# Patient Record
Sex: Female | Born: 1945 | Race: White | Hispanic: No | Smoking: Never smoker
Health system: Southern US, Community
[De-identification: ages and names within clinical notes are randomized; demographics above are authoritative.]

## PROBLEM LIST (undated history)

## (undated) DIAGNOSIS — G4730 Sleep apnea, unspecified: Secondary | ICD-10-CM

## (undated) DIAGNOSIS — M1990 Unspecified osteoarthritis, unspecified site: Secondary | ICD-10-CM

## (undated) DIAGNOSIS — E039 Hypothyroidism, unspecified: Secondary | ICD-10-CM

## (undated) DIAGNOSIS — I10 Essential (primary) hypertension: Secondary | ICD-10-CM

## (undated) DIAGNOSIS — G2581 Restless legs syndrome: Secondary | ICD-10-CM

## (undated) DIAGNOSIS — E119 Type 2 diabetes mellitus without complications: Secondary | ICD-10-CM

## (undated) DIAGNOSIS — Z972 Presence of dental prosthetic device (complete) (partial): Secondary | ICD-10-CM

## (undated) DIAGNOSIS — Z9109 Other allergy status, other than to drugs and biological substances: Secondary | ICD-10-CM

## (undated) DIAGNOSIS — E785 Hyperlipidemia, unspecified: Secondary | ICD-10-CM

## (undated) DIAGNOSIS — Z8673 Personal history of transient ischemic attack (TIA), and cerebral infarction without residual deficits: Secondary | ICD-10-CM

## (undated) DIAGNOSIS — Z98811 Dental restoration status: Secondary | ICD-10-CM

## (undated) DIAGNOSIS — N8110 Cystocele, unspecified: Secondary | ICD-10-CM

## (undated) DIAGNOSIS — R12 Heartburn: Secondary | ICD-10-CM

## (undated) DIAGNOSIS — N6091 Unspecified benign mammary dysplasia of right breast: Secondary | ICD-10-CM

## (undated) HISTORY — PX: CATARACT EXTRACTION W/ INTRAOCULAR LENS  IMPLANT, BILATERAL: SHX1307

## (undated) HISTORY — PX: FACIAL RECONSTRUCTION SURGERY: SHX631

## (undated) HISTORY — DX: Sleep apnea, unspecified: G47.30

## (undated) HISTORY — PX: KNEE ARTHROSCOPY: SHX127

## (undated) HISTORY — DX: Essential (primary) hypertension: I10

## (undated) HISTORY — DX: Unspecified osteoarthritis, unspecified site: M19.90

---

## 2011-12-27 HISTORY — PX: COLONOSCOPY: SHX174

## 2014-01-20 DIAGNOSIS — I10 Essential (primary) hypertension: Secondary | ICD-10-CM | POA: Diagnosis not present

## 2014-03-25 DIAGNOSIS — E119 Type 2 diabetes mellitus without complications: Secondary | ICD-10-CM | POA: Diagnosis not present

## 2014-04-02 DIAGNOSIS — E782 Mixed hyperlipidemia: Secondary | ICD-10-CM | POA: Diagnosis not present

## 2014-04-02 DIAGNOSIS — G4730 Sleep apnea, unspecified: Secondary | ICD-10-CM | POA: Diagnosis not present

## 2014-04-02 DIAGNOSIS — I10 Essential (primary) hypertension: Secondary | ICD-10-CM | POA: Diagnosis not present

## 2014-04-02 DIAGNOSIS — E119 Type 2 diabetes mellitus without complications: Secondary | ICD-10-CM | POA: Diagnosis not present

## 2014-04-02 DIAGNOSIS — E039 Hypothyroidism, unspecified: Secondary | ICD-10-CM | POA: Diagnosis not present

## 2014-04-02 DIAGNOSIS — Z79899 Other long term (current) drug therapy: Secondary | ICD-10-CM | POA: Diagnosis not present

## 2014-04-02 DIAGNOSIS — E785 Hyperlipidemia, unspecified: Secondary | ICD-10-CM | POA: Diagnosis not present

## 2014-04-10 DIAGNOSIS — Z1231 Encounter for screening mammogram for malignant neoplasm of breast: Secondary | ICD-10-CM | POA: Diagnosis not present

## 2014-05-09 DIAGNOSIS — B351 Tinea unguium: Secondary | ICD-10-CM | POA: Diagnosis not present

## 2014-05-09 DIAGNOSIS — L821 Other seborrheic keratosis: Secondary | ICD-10-CM | POA: Diagnosis not present

## 2014-05-09 DIAGNOSIS — L819 Disorder of pigmentation, unspecified: Secondary | ICD-10-CM | POA: Diagnosis not present

## 2014-06-30 DIAGNOSIS — G4733 Obstructive sleep apnea (adult) (pediatric): Secondary | ICD-10-CM | POA: Diagnosis not present

## 2014-06-30 DIAGNOSIS — G4761 Periodic limb movement disorder: Secondary | ICD-10-CM | POA: Diagnosis not present

## 2014-08-20 DIAGNOSIS — E119 Type 2 diabetes mellitus without complications: Secondary | ICD-10-CM | POA: Diagnosis not present

## 2014-08-20 DIAGNOSIS — Z79899 Other long term (current) drug therapy: Secondary | ICD-10-CM | POA: Diagnosis not present

## 2014-08-20 DIAGNOSIS — G4730 Sleep apnea, unspecified: Secondary | ICD-10-CM | POA: Diagnosis not present

## 2014-08-20 DIAGNOSIS — E785 Hyperlipidemia, unspecified: Secondary | ICD-10-CM | POA: Diagnosis not present

## 2014-08-20 DIAGNOSIS — I10 Essential (primary) hypertension: Secondary | ICD-10-CM | POA: Diagnosis not present

## 2014-08-20 DIAGNOSIS — E039 Hypothyroidism, unspecified: Secondary | ICD-10-CM | POA: Diagnosis not present

## 2014-08-20 DIAGNOSIS — E559 Vitamin D deficiency, unspecified: Secondary | ICD-10-CM | POA: Diagnosis not present

## 2014-08-20 DIAGNOSIS — E782 Mixed hyperlipidemia: Secondary | ICD-10-CM | POA: Diagnosis not present

## 2014-12-08 DIAGNOSIS — E119 Type 2 diabetes mellitus without complications: Secondary | ICD-10-CM | POA: Diagnosis not present

## 2014-12-08 DIAGNOSIS — E785 Hyperlipidemia, unspecified: Secondary | ICD-10-CM | POA: Diagnosis not present

## 2014-12-08 DIAGNOSIS — I10 Essential (primary) hypertension: Secondary | ICD-10-CM | POA: Diagnosis not present

## 2014-12-30 DIAGNOSIS — Z0000 Encounter for general adult medical examination without abnormal findings: Secondary | ICD-10-CM | POA: Diagnosis not present

## 2014-12-30 DIAGNOSIS — K219 Gastro-esophageal reflux disease without esophagitis: Secondary | ICD-10-CM | POA: Diagnosis not present

## 2014-12-30 DIAGNOSIS — E039 Hypothyroidism, unspecified: Secondary | ICD-10-CM | POA: Diagnosis not present

## 2014-12-30 DIAGNOSIS — I10 Essential (primary) hypertension: Secondary | ICD-10-CM | POA: Diagnosis not present

## 2014-12-30 DIAGNOSIS — E782 Mixed hyperlipidemia: Secondary | ICD-10-CM | POA: Diagnosis not present

## 2014-12-30 DIAGNOSIS — E0800 Diabetes mellitus due to underlying condition with hyperosmolarity without nonketotic hyperglycemic-hyperosmolar coma (NKHHC): Secondary | ICD-10-CM | POA: Diagnosis not present

## 2014-12-30 DIAGNOSIS — M1991 Primary osteoarthritis, unspecified site: Secondary | ICD-10-CM | POA: Diagnosis not present

## 2014-12-31 DIAGNOSIS — H40013 Open angle with borderline findings, low risk, bilateral: Secondary | ICD-10-CM | POA: Diagnosis not present

## 2014-12-31 DIAGNOSIS — H25813 Combined forms of age-related cataract, bilateral: Secondary | ICD-10-CM | POA: Diagnosis not present

## 2014-12-31 DIAGNOSIS — E119 Type 2 diabetes mellitus without complications: Secondary | ICD-10-CM | POA: Diagnosis not present

## 2015-01-13 DIAGNOSIS — I10 Essential (primary) hypertension: Secondary | ICD-10-CM | POA: Diagnosis not present

## 2015-01-13 DIAGNOSIS — E782 Mixed hyperlipidemia: Secondary | ICD-10-CM | POA: Diagnosis not present

## 2015-01-13 DIAGNOSIS — E0800 Diabetes mellitus due to underlying condition with hyperosmolarity without nonketotic hyperglycemic-hyperosmolar coma (NKHHC): Secondary | ICD-10-CM | POA: Diagnosis not present

## 2015-02-02 DIAGNOSIS — H2512 Age-related nuclear cataract, left eye: Secondary | ICD-10-CM | POA: Diagnosis not present

## 2015-02-02 DIAGNOSIS — H25812 Combined forms of age-related cataract, left eye: Secondary | ICD-10-CM | POA: Diagnosis not present

## 2015-02-11 DIAGNOSIS — Z780 Asymptomatic menopausal state: Secondary | ICD-10-CM | POA: Diagnosis not present

## 2015-02-11 DIAGNOSIS — M810 Age-related osteoporosis without current pathological fracture: Secondary | ICD-10-CM | POA: Diagnosis not present

## 2015-04-07 DIAGNOSIS — H2511 Age-related nuclear cataract, right eye: Secondary | ICD-10-CM | POA: Diagnosis not present

## 2015-04-13 DIAGNOSIS — H2511 Age-related nuclear cataract, right eye: Secondary | ICD-10-CM | POA: Diagnosis not present

## 2015-04-13 DIAGNOSIS — H25811 Combined forms of age-related cataract, right eye: Secondary | ICD-10-CM | POA: Diagnosis not present

## 2015-05-13 DIAGNOSIS — R7309 Other abnormal glucose: Secondary | ICD-10-CM | POA: Diagnosis not present

## 2015-06-02 DIAGNOSIS — E0810 Diabetes mellitus due to underlying condition with ketoacidosis without coma: Secondary | ICD-10-CM | POA: Diagnosis not present

## 2015-06-02 DIAGNOSIS — I10 Essential (primary) hypertension: Secondary | ICD-10-CM | POA: Diagnosis not present

## 2015-06-30 DIAGNOSIS — Z1231 Encounter for screening mammogram for malignant neoplasm of breast: Secondary | ICD-10-CM | POA: Diagnosis not present

## 2015-09-29 DIAGNOSIS — E0801 Diabetes mellitus due to underlying condition with hyperosmolarity with coma: Secondary | ICD-10-CM | POA: Diagnosis not present

## 2015-09-29 DIAGNOSIS — I10 Essential (primary) hypertension: Secondary | ICD-10-CM | POA: Diagnosis not present

## 2015-09-29 DIAGNOSIS — E0800 Diabetes mellitus due to underlying condition with hyperosmolarity without nonketotic hyperglycemic-hyperosmolar coma (NKHHC): Secondary | ICD-10-CM | POA: Diagnosis not present

## 2015-09-29 DIAGNOSIS — Z23 Encounter for immunization: Secondary | ICD-10-CM | POA: Diagnosis not present

## 2015-10-13 DIAGNOSIS — I10 Essential (primary) hypertension: Secondary | ICD-10-CM | POA: Diagnosis not present

## 2015-10-13 DIAGNOSIS — E118 Type 2 diabetes mellitus with unspecified complications: Secondary | ICD-10-CM | POA: Diagnosis not present

## 2015-10-13 DIAGNOSIS — Z6829 Body mass index (BMI) 29.0-29.9, adult: Secondary | ICD-10-CM | POA: Diagnosis not present

## 2016-01-07 DIAGNOSIS — H26493 Other secondary cataract, bilateral: Secondary | ICD-10-CM | POA: Diagnosis not present

## 2016-01-07 DIAGNOSIS — Z961 Presence of intraocular lens: Secondary | ICD-10-CM | POA: Diagnosis not present

## 2016-01-07 DIAGNOSIS — E119 Type 2 diabetes mellitus without complications: Secondary | ICD-10-CM | POA: Diagnosis not present

## 2016-01-07 DIAGNOSIS — E0801 Diabetes mellitus due to underlying condition with hyperosmolarity with coma: Secondary | ICD-10-CM | POA: Diagnosis not present

## 2016-01-07 DIAGNOSIS — I10 Essential (primary) hypertension: Secondary | ICD-10-CM | POA: Diagnosis not present

## 2016-01-07 DIAGNOSIS — E782 Mixed hyperlipidemia: Secondary | ICD-10-CM | POA: Diagnosis not present

## 2016-01-07 DIAGNOSIS — H40013 Open angle with borderline findings, low risk, bilateral: Secondary | ICD-10-CM | POA: Diagnosis not present

## 2016-01-07 DIAGNOSIS — E038 Other specified hypothyroidism: Secondary | ICD-10-CM | POA: Diagnosis not present

## 2016-01-11 DIAGNOSIS — E782 Mixed hyperlipidemia: Secondary | ICD-10-CM | POA: Diagnosis not present

## 2016-01-11 DIAGNOSIS — M1991 Primary osteoarthritis, unspecified site: Secondary | ICD-10-CM | POA: Diagnosis not present

## 2016-01-11 DIAGNOSIS — I10 Essential (primary) hypertension: Secondary | ICD-10-CM | POA: Diagnosis not present

## 2016-01-11 DIAGNOSIS — E089 Diabetes mellitus due to underlying condition without complications: Secondary | ICD-10-CM | POA: Diagnosis not present

## 2016-01-20 DIAGNOSIS — H26493 Other secondary cataract, bilateral: Secondary | ICD-10-CM | POA: Diagnosis not present

## 2016-03-28 DIAGNOSIS — M170 Bilateral primary osteoarthritis of knee: Secondary | ICD-10-CM | POA: Diagnosis not present

## 2016-04-07 DIAGNOSIS — M216X2 Other acquired deformities of left foot: Secondary | ICD-10-CM | POA: Diagnosis not present

## 2016-04-07 DIAGNOSIS — M2012 Hallux valgus (acquired), left foot: Secondary | ICD-10-CM | POA: Diagnosis not present

## 2016-04-07 DIAGNOSIS — E119 Type 2 diabetes mellitus without complications: Secondary | ICD-10-CM | POA: Diagnosis not present

## 2016-04-07 DIAGNOSIS — M2011 Hallux valgus (acquired), right foot: Secondary | ICD-10-CM | POA: Diagnosis not present

## 2016-04-07 DIAGNOSIS — M216X1 Other acquired deformities of right foot: Secondary | ICD-10-CM | POA: Diagnosis not present

## 2016-04-11 DIAGNOSIS — E782 Mixed hyperlipidemia: Secondary | ICD-10-CM | POA: Diagnosis not present

## 2016-04-11 DIAGNOSIS — I10 Essential (primary) hypertension: Secondary | ICD-10-CM | POA: Diagnosis not present

## 2016-04-11 DIAGNOSIS — G5603 Carpal tunnel syndrome, bilateral upper limbs: Secondary | ICD-10-CM | POA: Diagnosis not present

## 2016-04-11 DIAGNOSIS — E089 Diabetes mellitus due to underlying condition without complications: Secondary | ICD-10-CM | POA: Diagnosis not present

## 2016-04-11 DIAGNOSIS — M1991 Primary osteoarthritis, unspecified site: Secondary | ICD-10-CM | POA: Diagnosis not present

## 2016-04-12 DIAGNOSIS — I10 Essential (primary) hypertension: Secondary | ICD-10-CM | POA: Diagnosis not present

## 2016-04-12 DIAGNOSIS — E782 Mixed hyperlipidemia: Secondary | ICD-10-CM | POA: Diagnosis not present

## 2016-04-12 DIAGNOSIS — E089 Diabetes mellitus due to underlying condition without complications: Secondary | ICD-10-CM | POA: Diagnosis not present

## 2016-04-12 DIAGNOSIS — M1991 Primary osteoarthritis, unspecified site: Secondary | ICD-10-CM | POA: Diagnosis not present

## 2016-05-05 DIAGNOSIS — D1801 Hemangioma of skin and subcutaneous tissue: Secondary | ICD-10-CM | POA: Diagnosis not present

## 2016-05-05 DIAGNOSIS — L814 Other melanin hyperpigmentation: Secondary | ICD-10-CM | POA: Diagnosis not present

## 2016-05-05 DIAGNOSIS — L918 Other hypertrophic disorders of the skin: Secondary | ICD-10-CM | POA: Diagnosis not present

## 2016-05-05 DIAGNOSIS — L84 Corns and callosities: Secondary | ICD-10-CM | POA: Diagnosis not present

## 2016-05-05 DIAGNOSIS — L821 Other seborrheic keratosis: Secondary | ICD-10-CM | POA: Diagnosis not present

## 2016-05-10 DIAGNOSIS — E039 Hypothyroidism, unspecified: Secondary | ICD-10-CM | POA: Diagnosis not present

## 2016-05-10 DIAGNOSIS — E0800 Diabetes mellitus due to underlying condition with hyperosmolarity without nonketotic hyperglycemic-hyperosmolar coma (NKHHC): Secondary | ICD-10-CM | POA: Diagnosis not present

## 2016-05-10 DIAGNOSIS — I10 Essential (primary) hypertension: Secondary | ICD-10-CM | POA: Diagnosis not present

## 2016-05-10 DIAGNOSIS — E782 Mixed hyperlipidemia: Secondary | ICD-10-CM | POA: Diagnosis not present

## 2016-06-09 DIAGNOSIS — E782 Mixed hyperlipidemia: Secondary | ICD-10-CM | POA: Diagnosis not present

## 2016-06-09 DIAGNOSIS — M1991 Primary osteoarthritis, unspecified site: Secondary | ICD-10-CM | POA: Diagnosis not present

## 2016-06-09 DIAGNOSIS — I10 Essential (primary) hypertension: Secondary | ICD-10-CM | POA: Diagnosis not present

## 2016-06-09 DIAGNOSIS — E039 Hypothyroidism, unspecified: Secondary | ICD-10-CM | POA: Diagnosis not present

## 2016-07-14 DIAGNOSIS — Z803 Family history of malignant neoplasm of breast: Secondary | ICD-10-CM | POA: Diagnosis not present

## 2016-07-14 DIAGNOSIS — Z1231 Encounter for screening mammogram for malignant neoplasm of breast: Secondary | ICD-10-CM | POA: Diagnosis not present

## 2016-09-05 DIAGNOSIS — M1991 Primary osteoarthritis, unspecified site: Secondary | ICD-10-CM | POA: Diagnosis not present

## 2016-09-05 DIAGNOSIS — E0800 Diabetes mellitus due to underlying condition with hyperosmolarity without nonketotic hyperglycemic-hyperosmolar coma (NKHHC): Secondary | ICD-10-CM | POA: Diagnosis not present

## 2016-09-05 DIAGNOSIS — E782 Mixed hyperlipidemia: Secondary | ICD-10-CM | POA: Diagnosis not present

## 2016-09-05 DIAGNOSIS — I10 Essential (primary) hypertension: Secondary | ICD-10-CM | POA: Diagnosis not present

## 2016-09-05 DIAGNOSIS — G5603 Carpal tunnel syndrome, bilateral upper limbs: Secondary | ICD-10-CM | POA: Diagnosis not present

## 2016-09-07 DIAGNOSIS — I10 Essential (primary) hypertension: Secondary | ICD-10-CM | POA: Diagnosis not present

## 2016-09-07 DIAGNOSIS — E039 Hypothyroidism, unspecified: Secondary | ICD-10-CM | POA: Diagnosis not present

## 2016-09-07 DIAGNOSIS — E785 Hyperlipidemia, unspecified: Secondary | ICD-10-CM | POA: Diagnosis not present

## 2016-09-07 DIAGNOSIS — G259 Extrapyramidal and movement disorder, unspecified: Secondary | ICD-10-CM | POA: Diagnosis not present

## 2016-11-16 DIAGNOSIS — Z6830 Body mass index (BMI) 30.0-30.9, adult: Secondary | ICD-10-CM | POA: Diagnosis not present

## 2016-11-16 DIAGNOSIS — E785 Hyperlipidemia, unspecified: Secondary | ICD-10-CM | POA: Diagnosis not present

## 2016-11-16 DIAGNOSIS — E7801 Familial hypercholesterolemia: Secondary | ICD-10-CM | POA: Diagnosis not present

## 2016-11-16 DIAGNOSIS — E118 Type 2 diabetes mellitus with unspecified complications: Secondary | ICD-10-CM | POA: Diagnosis not present

## 2016-11-16 DIAGNOSIS — I10 Essential (primary) hypertension: Secondary | ICD-10-CM | POA: Diagnosis not present

## 2016-11-28 DIAGNOSIS — E039 Hypothyroidism, unspecified: Secondary | ICD-10-CM | POA: Diagnosis not present

## 2016-11-28 DIAGNOSIS — E782 Mixed hyperlipidemia: Secondary | ICD-10-CM | POA: Diagnosis not present

## 2016-11-28 DIAGNOSIS — R232 Flushing: Secondary | ICD-10-CM | POA: Diagnosis not present

## 2016-11-28 DIAGNOSIS — E0810 Diabetes mellitus due to underlying condition with ketoacidosis without coma: Secondary | ICD-10-CM | POA: Diagnosis not present

## 2016-12-21 DIAGNOSIS — R232 Flushing: Secondary | ICD-10-CM | POA: Diagnosis not present

## 2016-12-21 DIAGNOSIS — E119 Type 2 diabetes mellitus without complications: Secondary | ICD-10-CM | POA: Diagnosis not present

## 2017-01-09 DIAGNOSIS — I10 Essential (primary) hypertension: Secondary | ICD-10-CM | POA: Diagnosis not present

## 2017-01-09 DIAGNOSIS — E0810 Diabetes mellitus due to underlying condition with ketoacidosis without coma: Secondary | ICD-10-CM | POA: Diagnosis not present

## 2017-02-03 DIAGNOSIS — B302 Viral pharyngoconjunctivitis: Secondary | ICD-10-CM | POA: Diagnosis not present

## 2017-02-03 DIAGNOSIS — H6506 Acute serous otitis media, recurrent, bilateral: Secondary | ICD-10-CM | POA: Diagnosis not present

## 2017-02-03 DIAGNOSIS — J1189 Influenza due to unidentified influenza virus with other manifestations: Secondary | ICD-10-CM | POA: Diagnosis not present

## 2017-02-16 DIAGNOSIS — E782 Mixed hyperlipidemia: Secondary | ICD-10-CM | POA: Diagnosis not present

## 2017-02-16 DIAGNOSIS — I10 Essential (primary) hypertension: Secondary | ICD-10-CM | POA: Diagnosis not present

## 2017-02-16 DIAGNOSIS — H6506 Acute serous otitis media, recurrent, bilateral: Secondary | ICD-10-CM | POA: Diagnosis not present

## 2017-03-10 DIAGNOSIS — H40013 Open angle with borderline findings, low risk, bilateral: Secondary | ICD-10-CM | POA: Diagnosis not present

## 2017-03-10 DIAGNOSIS — Z961 Presence of intraocular lens: Secondary | ICD-10-CM | POA: Diagnosis not present

## 2017-03-10 DIAGNOSIS — E119 Type 2 diabetes mellitus without complications: Secondary | ICD-10-CM | POA: Diagnosis not present

## 2017-04-03 DIAGNOSIS — H6506 Acute serous otitis media, recurrent, bilateral: Secondary | ICD-10-CM | POA: Diagnosis not present

## 2017-04-03 DIAGNOSIS — I10 Essential (primary) hypertension: Secondary | ICD-10-CM | POA: Diagnosis not present

## 2017-04-03 DIAGNOSIS — E782 Mixed hyperlipidemia: Secondary | ICD-10-CM | POA: Diagnosis not present

## 2017-04-10 DIAGNOSIS — I10 Essential (primary) hypertension: Secondary | ICD-10-CM | POA: Diagnosis not present

## 2017-04-10 DIAGNOSIS — E118 Type 2 diabetes mellitus with unspecified complications: Secondary | ICD-10-CM | POA: Diagnosis not present

## 2017-04-10 DIAGNOSIS — E782 Mixed hyperlipidemia: Secondary | ICD-10-CM | POA: Diagnosis not present

## 2017-04-10 DIAGNOSIS — R232 Flushing: Secondary | ICD-10-CM | POA: Diagnosis not present

## 2017-05-19 ENCOUNTER — Ambulatory Visit
Admission: RE | Admit: 2017-05-19 | Discharge: 2017-05-19 | Disposition: A | Discharge status: Home / Self Care | Payer: Medicare Other | Source: Ambulatory Visit | Attending: Family Medicine | Admitting: Family Medicine

## 2017-05-19 ENCOUNTER — Other Ambulatory Visit: Payer: Self-pay | Admitting: Family Medicine

## 2017-05-19 DIAGNOSIS — R05 Cough: Secondary | ICD-10-CM

## 2017-05-19 DIAGNOSIS — E034 Atrophy of thyroid (acquired): Secondary | ICD-10-CM | POA: Diagnosis not present

## 2017-05-19 DIAGNOSIS — E039 Hypothyroidism, unspecified: Secondary | ICD-10-CM | POA: Diagnosis not present

## 2017-05-19 DIAGNOSIS — R059 Cough, unspecified: Secondary | ICD-10-CM

## 2017-05-19 DIAGNOSIS — E782 Mixed hyperlipidemia: Secondary | ICD-10-CM | POA: Diagnosis not present

## 2017-05-19 DIAGNOSIS — E0810 Diabetes mellitus due to underlying condition with ketoacidosis without coma: Secondary | ICD-10-CM | POA: Diagnosis not present

## 2017-05-19 DIAGNOSIS — I10 Essential (primary) hypertension: Secondary | ICD-10-CM | POA: Diagnosis not present

## 2017-06-23 DIAGNOSIS — E0810 Diabetes mellitus due to underlying condition with ketoacidosis without coma: Secondary | ICD-10-CM | POA: Diagnosis not present

## 2017-06-23 DIAGNOSIS — N390 Urinary tract infection, site not specified: Secondary | ICD-10-CM | POA: Diagnosis not present

## 2017-06-29 DIAGNOSIS — E118 Type 2 diabetes mellitus with unspecified complications: Secondary | ICD-10-CM | POA: Diagnosis not present

## 2017-07-10 DIAGNOSIS — I110 Hypertensive heart disease with heart failure: Secondary | ICD-10-CM | POA: Diagnosis not present

## 2017-07-10 DIAGNOSIS — E118 Type 2 diabetes mellitus with unspecified complications: Secondary | ICD-10-CM | POA: Diagnosis not present

## 2017-07-10 DIAGNOSIS — I10 Essential (primary) hypertension: Secondary | ICD-10-CM | POA: Diagnosis not present

## 2017-07-11 DIAGNOSIS — E118 Type 2 diabetes mellitus with unspecified complications: Secondary | ICD-10-CM | POA: Diagnosis not present

## 2017-07-17 DIAGNOSIS — Z803 Family history of malignant neoplasm of breast: Secondary | ICD-10-CM | POA: Diagnosis not present

## 2017-07-17 DIAGNOSIS — Z1231 Encounter for screening mammogram for malignant neoplasm of breast: Secondary | ICD-10-CM | POA: Diagnosis not present

## 2017-07-21 DIAGNOSIS — R591 Generalized enlarged lymph nodes: Secondary | ICD-10-CM | POA: Diagnosis not present

## 2017-07-21 DIAGNOSIS — R921 Mammographic calcification found on diagnostic imaging of breast: Secondary | ICD-10-CM | POA: Diagnosis not present

## 2017-07-24 ENCOUNTER — Other Ambulatory Visit: Payer: Self-pay | Admitting: Radiology

## 2017-07-24 DIAGNOSIS — M25561 Pain in right knee: Secondary | ICD-10-CM | POA: Diagnosis not present

## 2017-07-24 DIAGNOSIS — R921 Mammographic calcification found on diagnostic imaging of breast: Secondary | ICD-10-CM | POA: Diagnosis not present

## 2017-07-24 DIAGNOSIS — R591 Generalized enlarged lymph nodes: Secondary | ICD-10-CM | POA: Diagnosis not present

## 2017-07-24 DIAGNOSIS — M1711 Unilateral primary osteoarthritis, right knee: Secondary | ICD-10-CM | POA: Diagnosis not present

## 2017-07-24 DIAGNOSIS — M25562 Pain in left knee: Secondary | ICD-10-CM | POA: Diagnosis not present

## 2017-07-24 DIAGNOSIS — M1712 Unilateral primary osteoarthritis, left knee: Secondary | ICD-10-CM | POA: Diagnosis not present

## 2017-07-24 DIAGNOSIS — N6091 Unspecified benign mammary dysplasia of right breast: Secondary | ICD-10-CM | POA: Diagnosis not present

## 2017-08-02 ENCOUNTER — Emergency Department (HOSPITAL_COMMUNITY): Payer: Medicare Other

## 2017-08-02 ENCOUNTER — Emergency Department (HOSPITAL_COMMUNITY)
Admission: EM | Admit: 2017-08-02 | Discharge: 2017-08-02 | Disposition: A | Discharge status: Home / Self Care | Payer: Medicare Other | Attending: Emergency Medicine | Admitting: Emergency Medicine

## 2017-08-02 ENCOUNTER — Encounter (HOSPITAL_COMMUNITY): Payer: Self-pay | Admitting: Emergency Medicine

## 2017-08-02 DIAGNOSIS — R202 Paresthesia of skin: Secondary | ICD-10-CM | POA: Diagnosis not present

## 2017-08-02 DIAGNOSIS — Z7984 Long term (current) use of oral hypoglycemic drugs: Secondary | ICD-10-CM | POA: Diagnosis not present

## 2017-08-02 DIAGNOSIS — R110 Nausea: Secondary | ICD-10-CM | POA: Diagnosis not present

## 2017-08-02 DIAGNOSIS — Z7982 Long term (current) use of aspirin: Secondary | ICD-10-CM | POA: Diagnosis not present

## 2017-08-02 DIAGNOSIS — E119 Type 2 diabetes mellitus without complications: Secondary | ICD-10-CM | POA: Insufficient documentation

## 2017-08-02 DIAGNOSIS — Z79899 Other long term (current) drug therapy: Secondary | ICD-10-CM | POA: Insufficient documentation

## 2017-08-02 DIAGNOSIS — R5383 Other fatigue: Secondary | ICD-10-CM | POA: Insufficient documentation

## 2017-08-02 DIAGNOSIS — R200 Anesthesia of skin: Secondary | ICD-10-CM | POA: Diagnosis not present

## 2017-08-02 LAB — CBC
HEMATOCRIT: 34.6 % — AB (ref 36.0–46.0)
Hemoglobin: 11.3 g/dL — ABNORMAL LOW (ref 12.0–15.0)
MCH: 26.8 pg (ref 26.0–34.0)
MCHC: 32.7 g/dL (ref 30.0–36.0)
MCV: 82.2 fL (ref 78.0–100.0)
Platelets: 253 10*3/uL (ref 150–400)
RBC: 4.21 MIL/uL (ref 3.87–5.11)
RDW: 15.3 % (ref 11.5–15.5)
WBC: 12 10*3/uL — ABNORMAL HIGH (ref 4.0–10.5)

## 2017-08-02 LAB — I-STAT TROPONIN, ED: Troponin i, poc: 0.01 ng/mL (ref 0.00–0.08)

## 2017-08-02 LAB — BASIC METABOLIC PANEL
Anion gap: 10 (ref 5–15)
BUN: 22 mg/dL — AB (ref 6–20)
CHLORIDE: 103 mmol/L (ref 101–111)
CO2: 23 mmol/L (ref 22–32)
CREATININE: 0.66 mg/dL (ref 0.44–1.00)
Calcium: 9.4 mg/dL (ref 8.9–10.3)
GFR calc Af Amer: >60 mL/min (ref >60)
GFR calc non Af Amer: >60 mL/min (ref >60)
Glucose, Bld: 253 mg/dL — ABNORMAL HIGH (ref 65–99)
POTASSIUM: 4.5 mmol/L (ref 3.5–5.1)
Sodium: 136 mmol/L (ref 135–145)

## 2017-08-02 LAB — CBG MONITORING, ED: Glucose-Capillary: 237 mg/dL — ABNORMAL HIGH (ref 65–99)

## 2017-08-02 MED ORDER — SODIUM CHLORIDE 0.9 % IV BOLUS (SEPSIS)
1000.0000 mL | Freq: Once | INTRAVENOUS | Status: AC
  Administered 2017-08-02: 1000 mL via INTRAVENOUS

## 2017-08-02 NOTE — ED Provider Notes (Signed)
Patient visit shared.  Patient here following an episode of paresthesias to her left hand and she felt hot and diaphoretic at that time. Her symptoms have resolved in the emergency department. MRI is negative for acute abnormality per radiology. The troponin 2 is negative and EKG with no ischemic changes. Plan to DC home with outpatient follow-up and return precautions.   Quintella Reichert, MD 08/02/17 [phone]

## 2017-08-02 NOTE — ED Notes (Addendum)
Jill Contreras/daughter [phone]

## 2017-08-02 NOTE — ED Triage Notes (Addendum)
Patient reports that she was sitting in floor reading to grandson when "felt bad and broke out in cold sweat then my left arm went numb".  Patient also states that she had onset of nausea and have to have BM.  Patient has less numbness now but has some numbness.  Patient has no neuro deficits in triage at this time. Patient adds that last night she was having leg cramps. Patient had recent BP medication change.

## 2017-08-03 LAB — I-STAT TROPONIN, ED: TROPONIN I, POC: 0 ng/mL (ref 0.00–0.08)

## 2017-08-03 NOTE — ED Provider Notes (Signed)
[facility] DEPT Provider Note   CSN: [id number] Arrival date & time: 08/02/17  1026     History   Chief Complaint Chief Complaint  Patient presents with  . Fatigue  . arm numbness    left     HPI Jill Contreras is a 71 y.o. female hx of DM, HL, previous TIA here with L arm paresthesias. She woke up around 7 am and was feeling fine. Around 8 am, she was reading to her grandson and had sudden onset of L arm paresthesias and diaphoresis. She felt like she needed to have a bowel movement as well. She then had R facial numbness about 30 minutes later. She states that she came to the ED and the symptoms resolved on arrival. She denies trouble speaking or weakness. She had similar symptoms 15 years ago and was admitted and had MRI that didn't show a stroke and was diagnosed with TIA and is still taking ASA 325 mg daily. Denies chest pain during this episode. Denies hx of CAD.   The history is provided by the patient.    Past Medical History:  Diagnosis Date  . Diabetes mellitus without complication ([place])   . High cholesterol     There are no active problems to display for this patient.   Past Surgical History:  Procedure Laterality Date  . BIOPSY BREAST    . KNEE SURGERY      OB History    No data available       Home Medications    Prior to Admission medications   Medication Sig Start Date End Date Taking? Authorizing Provider  aspirin EC 325 MG tablet Take 325 mg by mouth at bedtime.   Yes [provider]  aspirin EC 81 MG tablet Take 162 mg by mouth every 4 (four) hours as needed for mild pain.   Yes [provider]  atorvastatin (LIPITOR) 80 MG tablet Take 80 mg by mouth at bedtime.   Yes [provider]  ezetimibe (ZETIA) 10 MG tablet Take 10 mg by mouth daily.   Yes [provider]  levothyroxine (SYNTHROID, LEVOTHROID) 100 MCG tablet Take 100 mcg by mouth daily before breakfast.   Yes [provider]  metFORMIN  (GLUCOPHAGE) 1000 MG tablet Take 500 mg by mouth 3 (three) times daily.  05/09/17  Yes [provider]  Multiple Vitamins-Minerals (ADULT GUMMY) CHEW Chew 2 each by mouth daily.   Yes [provider]  omeprazole (PRILOSEC) 20 MG capsule Take 20 mg by mouth daily.   Yes [provider]  pramipexole (MIRAPEX) 0.5 MG tablet Take 0.5 mg by mouth at bedtime.   Yes [provider]  pramipexole (MIRAPEX) 1 MG tablet Take 1 mg by mouth at bedtime.  07/05/17  Yes [provider]  sacubitril-valsartan (ENTRESTO) 49-51 MG Take 1 tablet by mouth 2 (two) times daily.   Yes [provider]  sitaGLIPtin (JANUVIA) 100 MG tablet Take 100 mg by mouth daily.   Yes [provider]    Family History No family history on file.  Social History Social History  Substance Use Topics  . Smoking status: Never Smoker  . Smokeless tobacco: Never Used  . Alcohol use No     Allergies   Mango flavor; Poison ivy extract; and Penicillins   Review of Systems Review of Systems  Neurological: Positive for numbness.  All other systems reviewed and are negative.    Physical Exam Updated Vital Signs BP (!) 119/57 (BP  Location: Left Arm)   Pulse 84   Temp 97.8 F (36.6 C) (Oral)   Resp 16   Ht 5\' 5"  (1.651 m)   Wt 78.9 kg (174 lb)   SpO2 100%   BMI 28.96 kg/m   Physical Exam  Constitutional: She is oriented to person, place, and time. She appears well-developed.  HENT:  Head: Normocephalic.  Mouth/Throat: Oropharynx is clear and moist.  Eyes: Pupils are equal, round, and reactive to light. Conjunctivae and EOM are normal.  Neck: Normal range of motion. Neck supple.  Cardiovascular: Normal rate, regular rhythm and normal heart sounds.   Pulmonary/Chest: Effort normal and breath sounds normal. No respiratory distress. She has no wheezes.  Abdominal: Soft. Bowel sounds are normal. She exhibits no distension. There is no tenderness.    Musculoskeletal: Normal range of motion.  Neurological: She is alert and oriented to person, place, and time. No cranial nerve deficit. Coordination normal.  CN 2-12 intact. Nl strength and sensation throughout. Nl finger to nose. Nl gait   Skin: Skin is warm.  Psychiatric: She has a normal mood and affect.  Nursing note and vitals reviewed.    ED Treatments / Results  Labs (all labs ordered are listed, but only abnormal results are displayed) Labs Reviewed  BASIC METABOLIC PANEL - Abnormal; Notable for the following:       Result Value   Glucose, Bld 253 (*)    BUN 22 (*)    All other components within normal limits  CBC - Abnormal; Notable for the following:    WBC 12.0 (*)    Hemoglobin 11.3 (*)    HCT 34.6 (*)    All other components within normal limits  CBG MONITORING, ED - Abnormal; Notable for the following:    Glucose-Capillary 237 (*)    All other components within normal limits  I-STAT TROPONIN, ED  I-STAT TROPONIN, ED    EKG  EKG Interpretation  Date/Time:  Wednesday August 02 2017 13:25:02 EDT Ventricular Rate:  79 PR Interval:    QRS Duration: 100 QT Interval:  391 QTC Calculation: 449 R Axis:   20 Text Interpretation:  Sinus rhythm RSR' in V1 or V2, right VCD or RVH No previous ECGs available Confirmed by Wandra Arthurs [phone]) on 08/02/2017 1:40:16 PM       Radiology Dg Chest 2 View  Result Date: 08/02/2017 CLINICAL DATA:  Acute onset left arm numbness, diaphoresis, and nausea today. EXAM: CHEST  2 VIEW COMPARISON:  05/19/2017 FINDINGS: The heart size and mediastinal contours are within normal limits. Aortic atherosclerosis. Both lungs are clear. Mild thoracic spine degenerative disc disease noted. IMPRESSION: No active cardiopulmonary disease. Electronically Signed   By: Earle Gell M.D.   On: 08/02/2017 13:56   Mr Brain Wo Contrast  Result Date: 08/02/2017 CLINICAL DATA:  71 y/o  F; left arm numbness. EXAM: MRI HEAD WITHOUT CONTRAST TECHNIQUE:  Multiplanar, multiecho pulse sequences of the brain and surrounding structures were obtained without intravenous contrast. COMPARISON:  None. FINDINGS: Brain: No acute infarction, hemorrhage, hydrocephalus, extra-axial collection or mass lesion. Vascular: Normal flow voids. Skull and upper cervical spine: Normal marrow signal. Sinuses/Orbits: Moderate ethmoid sinus mucosal thickening and small left frontal mucous retention cyst. Trace left mastoid tip effusion. Bilateral intra-ocular lens replacement. Other: None. IMPRESSION: 1. No acute intracranial abnormality. 2. Mild paranasal sinus disease. 3. Otherwise unremarkable MRI of the head. Electronically Signed   By: Kristine Garbe M.D.   On: 08/02/2017 17:09  Procedures Procedures (including critical care time)  Medications Ordered in ED Medications  sodium chloride 0.9 % bolus 1,000 mL (0 mLs Intravenous Stopped 08/02/17 1445)     Initial Impression / Assessment and Plan / ED Course  I have reviewed the triage vital signs and the nursing notes.  Pertinent labs & imaging results that were available during my care of the patient were reviewed by me and considered in my medical decision making (see chart for details).    Finn Altemose is a 71 y.o. female here with L arm and R facial paresthesias that resolved. Consider TIA, less likely stroke vs peripheral neuropathy, less likely ACS. Hx of TIA and is on full dose ASA. Symptoms completely resolved Will get labs, Trop x 2, MRI brain.   4 pm Labs showed glucose 253, nl AG. Trop neg x 1. MRI brain pending. Second trop pending. Signed out to Dr. Ralene Bathe to follow up repeat trop and MRI brain. If both normal, can be discharged.      Final Clinical Impressions(s) / ED Diagnoses   Final diagnoses:  Paresthesia    New Prescriptions Discharge Medication List as of 08/02/2017  5:46 PM       Drenda Freeze, MD 08/03/17 1501

## 2017-08-04 DIAGNOSIS — G5711 Meralgia paresthetica, right lower limb: Secondary | ICD-10-CM | POA: Diagnosis not present

## 2017-08-04 DIAGNOSIS — D0511 Intraductal carcinoma in situ of right breast: Secondary | ICD-10-CM | POA: Diagnosis not present

## 2017-08-04 DIAGNOSIS — E785 Hyperlipidemia, unspecified: Secondary | ICD-10-CM | POA: Diagnosis not present

## 2017-08-04 DIAGNOSIS — I110 Hypertensive heart disease with heart failure: Secondary | ICD-10-CM | POA: Diagnosis not present

## 2017-08-18 DIAGNOSIS — Z0000 Encounter for general adult medical examination without abnormal findings: Secondary | ICD-10-CM | POA: Diagnosis not present

## 2017-08-23 ENCOUNTER — Other Ambulatory Visit: Payer: Self-pay | Admitting: General Surgery

## 2017-08-23 DIAGNOSIS — Z803 Family history of malignant neoplasm of breast: Secondary | ICD-10-CM | POA: Diagnosis not present

## 2017-08-23 DIAGNOSIS — G4730 Sleep apnea, unspecified: Secondary | ICD-10-CM | POA: Diagnosis not present

## 2017-08-23 DIAGNOSIS — R202 Paresthesia of skin: Secondary | ICD-10-CM | POA: Diagnosis not present

## 2017-08-23 DIAGNOSIS — E119 Type 2 diabetes mellitus without complications: Secondary | ICD-10-CM | POA: Diagnosis not present

## 2017-08-23 DIAGNOSIS — N6091 Unspecified benign mammary dysplasia of right breast: Secondary | ICD-10-CM

## 2017-08-23 DIAGNOSIS — E785 Hyperlipidemia, unspecified: Secondary | ICD-10-CM | POA: Diagnosis not present

## 2017-09-04 DIAGNOSIS — R202 Paresthesia of skin: Secondary | ICD-10-CM | POA: Diagnosis not present

## 2017-09-04 DIAGNOSIS — R5383 Other fatigue: Secondary | ICD-10-CM | POA: Diagnosis not present

## 2017-10-12 ENCOUNTER — Encounter: Payer: Self-pay | Admitting: *Deleted

## 2017-10-13 ENCOUNTER — Ambulatory Visit (INDEPENDENT_AMBULATORY_CARE_PROVIDER_SITE_OTHER): Payer: Medicare Other | Admitting: Diagnostic Neuroimaging

## 2017-10-13 ENCOUNTER — Encounter: Payer: Self-pay | Admitting: Diagnostic Neuroimaging

## 2017-10-13 VITALS — BP 136/76 | HR 81 | Ht 65.0 in | Wt 171.8 lb

## 2017-10-13 DIAGNOSIS — G459 Transient cerebral ischemic attack, unspecified: Secondary | ICD-10-CM | POA: Diagnosis not present

## 2017-10-13 NOTE — Progress Notes (Signed)
[facility]  PATIENT: Jill Contreras DOB: 1946-12-24  REFERRING CLINICIAN: Clayburn Pert HISTORY FROM: patient and chart review REASON FOR VISIT: new consult    HISTORICAL  CHIEF COMPLAINT:  Chief Complaint  Patient presents with  . Meralgia parethetica    rm 6, New Pt, "episode of numbness left hand, sweating and nausea in Aug, went to ED, had MRI, xray"    HISTORY OF PRESENT ILLNESS:   71 year old female with hypertension, diabetes, hypercholesteremia, TIA, here for valuation of transient left arm and right face numbness.  08/02/17 patient was at home, her grandson, playing on the floor, when all of a sudden she broke out into a cold sweat, then felt numbness in her left hand and left arm. She also noticed some right face numbness. She had some nausea. This lasted approximately 12 hours. Patient went to the emergency room for evaluation. MRI of the brain was negative for acute ischemic infarction. Blood testing was negative. Patient followed up with PCP who recommended neurology follow-up.  Patient had a different episode in 2003 where she felt slurred speech and gait difficulty. Apparently she was evaluated in the hospital for possible TIA. Around that time patient had cough and cold symptoms and was taking a lot of cough medication.  Patient also has bilateral knee arthritis and is getting workup for possible knee replacement.  July 2018 patient had mammogram which detected an abnormality for which patient is scheduled to have lumpectomy. However the surgeon is awaiting neurology consultation today before proceeding with surgery.    REVIEW OF SYSTEMS: Full 14 system review of systems performed and negative with exception of: Fatigue birthmarks incontinence joint pain cramps aching muscles numbness insomnia snoring restless legs decreased energy.  ALLERGIES: Allergies  Allergen Reactions  . Mango Flavor Swelling    Swelling of face and eyelids   . Poison Ivy  Extract Hives and Rash    Severe hives and rash all over body  . Penicillins Itching and Other (See Comments)    Redness around lips Has patient had a PCN reaction causing immediate rash, facial/tongue/throat swelling, SOB or lightheadedness with hypotension: No Has patient had a PCN reaction causing severe rash involving mucus membranes or skin necrosis: Yes Has patient had a PCN reaction that required hospitalization: No Has patient had a PCN reaction occurring within the last 10 years: No If all of the above answers are "NO", then may proceed with Cephalosporin use.     HOME MEDICATIONS: Outpatient Medications Prior to Visit  Medication Sig Dispense Refill  . aspirin EC 325 MG tablet Take 325 mg by mouth at bedtime.    Marland Kitchen atorvastatin (LIPITOR) 80 MG tablet Take 80 mg by mouth at bedtime.    Marland Kitchen ezetimibe (ZETIA) 10 MG tablet Take 10 mg by mouth daily.    Marland Kitchen levothyroxine (SYNTHROID, LEVOTHROID) 100 MCG tablet Take 100 mcg by mouth daily before breakfast.    . metFORMIN (GLUCOPHAGE) 1000 MG tablet Take 500 mg by mouth 3 (three) times daily.     . Multiple Vitamins-Minerals (ADULT GUMMY) CHEW Chew 2 each by mouth daily.    Marland Kitchen omeprazole (PRILOSEC) 20 MG capsule Take 20 mg by mouth daily.    . pramipexole (MIRAPEX) 1 MG tablet Take 1 mg by mouth at bedtime.     . sitaGLIPtin (JANUVIA) 100 MG tablet Take 100 mg by mouth daily.    . sacubitril-valsartan (ENTRESTO) 49-51 MG Take 1 tablet by mouth 2 (two) times daily.    Marland Kitchen  aspirin EC 81 MG tablet Take 162 mg by mouth every 4 (four) hours as needed for mild pain.    . pramipexole (MIRAPEX) 0.5 MG tablet Take 0.5 mg by mouth at bedtime.     No facility-administered medications prior to visit.     PAST MEDICAL HISTORY: Past Medical History:  Diagnosis Date  . Arthritis    knees  . Diabetes mellitus without complication ([place])   . High cholesterol   . Hypertension   . Sleep apnea     PAST SURGICAL HISTORY: Past Surgical History:    Procedure Laterality Date  . BIOPSY BREAST  07/2017  . FACIAL RECONSTRUCTION SURGERY     age 55  . KNEE SURGERY Left 2008   arthroscopic    FAMILY HISTORY: Family History  Problem Relation Age of Onset  . Cancer Mother        breast  . Lymphoma Mother   . Heart disease Father        blockage in carotid  . Heart disease Brother        smoker    SOCIAL HISTORY:  Social History   Social History  . Marital status: Widowed    Spouse name: N/A  . Number of children: 4  . Years of education: 25   Occupational History  .      na   Social History Main Topics  . Smoking status: Never Smoker  . Smokeless tobacco: Never Used  . Alcohol use No  . Drug use: No  . Sexual activity: Not on file   Other Topics Concern  . Not on file   Social History Narrative   Lives alone   Caffeine- coffee, Coke, 2 daily     PHYSICAL EXAM  GENERAL EXAM/CONSTITUTIONAL: Vitals:  Vitals:   10/13/17 1058  BP: 136/76  Pulse: 81  Weight: 171 lb 12.8 oz (77.9 kg)  Height: 5\' 5"  (1.651 m)     Body mass index is 28.59 kg/m.  Visual Acuity Screening   Right eye Left eye Both eyes  Without correction: 20/30 20/30   With correction:        Patient is in no distress; well developed, nourished and groomed; neck is supple  CARDIOVASCULAR:  Examination of carotid arteries is normal; no carotid bruits  Regular rate and rhythm, no murmurs  Examination of peripheral vascular system by observation and palpation is normal  EYES:  Ophthalmoscopic exam of optic discs and posterior segments is normal; no papilledema or hemorrhages  MUSCULOSKELETAL:  Gait, strength, tone, movements noted in Neurologic exam below  NEUROLOGIC: MENTAL STATUS:  No flowsheet data found.  awake, alert, oriented to person, place and time  recent and remote memory intact  normal attention and concentration  language fluent, comprehension intact, naming intact,   fund of knowledge  appropriate  CRANIAL NERVE:   2nd - no papilledema on fundoscopic exam  2nd, 3rd, 4th, 6th - pupils equal and reactive to light, visual fields full to confrontation, extraocular muscles intact, no nystagmus  5th - facial sensation symmetric  7th - facial strength symmetric  8th - hearing intact  9th - palate elevates symmetrically, uvula midline  11th - shoulder shrug symmetric  12th - tongue protrusion midline  MOTOR:   normal bulk and tone, full strength in the BUE, BLE  SENSORY:   normal and symmetric to light touch, temperature, vibration  COORDINATION:   finger-nose-finger, fine finger movements normal  REFLEXES:   deep tendon reflexes present and symmetric  GAIT/STATION:   narrow based gait    DIAGNOSTIC DATA (LABS, IMAGING, TESTING) - I reviewed patient records, labs, notes, testing and imaging myself where available.  Lab Results  Component Value Date   WBC 12.0 (H) 08/02/2017   HGB 11.3 (L) 08/02/2017   HCT 34.6 (L) 08/02/2017   MCV 82.2 08/02/2017   PLT 253 08/02/2017      Component Value Date/Time   NA 136 08/02/2017 1139   K 4.5 08/02/2017 1139   CL 103 08/02/2017 1139   CO2 23 08/02/2017 1139   GLUCOSE 253 (H) 08/02/2017 1139   BUN 22 (H) 08/02/2017 1139   CREATININE 0.66 08/02/2017 1139   CALCIUM 9.4 08/02/2017 1139   GFRNONAA >60 08/02/2017 1139   GFRAA >60 08/02/2017 1139   No results found for: CHOL, HDL, LDLCALC, LDLDIRECT, TRIG, CHOLHDL No results found for: HGBA1C No results found for: VITAMINB12 No results found for: TSH   08/02/17 MRI brain [I reviewed images myself and agree with interpretation. -VRP]  1. No acute intracranial abnormality. 2. Mild paranasal sinus disease. 3. Otherwise unremarkable MRI of the head.     ASSESSMENT AND PLAN  71 y.o. year old female here with transient left arm and right face numbness. Findings localize to the right pons / brainstem. Will proceed with TIA workup.   Dx:  1. TIA  (transient ischemic attack)      PLAN:  TRANSIENT LEFT ARM / RIGHT FACE NUMBNESS (possible TIA) - check MRA head / neck (to eval anterior and posterior circulation) - check TTE - continue medical mgmt of risk factors for stroke prevention: aspirin 325mg , statin, BP control, diabetes control, sleep apnea treatment  Orders Placed This Encounter  Procedures  . MR MRA HEAD WO CONTRAST  . MR MRA NECK W WO CONTRAST  . ECHOCARDIOGRAM COMPLETE   Return in about 3 months (around 01/13/2018).    Penni Bombard, MD 46/56/8127, 51:70 AM Certified in Neurology, Neurophysiology and Neuroimaging  [facility] [address], [place], [place] [address] [phone]

## 2017-10-13 NOTE — Patient Instructions (Signed)
  TRANSIENT LEFT ARM / RIGHT FACE NUMBNESS (possible TIA) - check MRA head / neck (to eval anterior and posterior circulation)  - check TTE (ultrasound of heart)  - continue medical mgmt of risk factors for stroke prevention: aspirin 325mg , statin, BP control, diabetes control, sleep apnea treatment

## 2017-10-23 ENCOUNTER — Other Ambulatory Visit: Payer: Self-pay

## 2017-10-23 ENCOUNTER — Ambulatory Visit (HOSPITAL_COMMUNITY): Payer: Medicare Other | Attending: Cardiology

## 2017-10-23 DIAGNOSIS — I5030 Unspecified diastolic (congestive) heart failure: Secondary | ICD-10-CM | POA: Diagnosis not present

## 2017-10-23 DIAGNOSIS — G459 Transient cerebral ischemic attack, unspecified: Secondary | ICD-10-CM

## 2017-10-27 ENCOUNTER — Ambulatory Visit
Admission: RE | Admit: 2017-10-27 | Discharge: 2017-10-27 | Disposition: A | Discharge status: Home / Self Care | Payer: Medicare Other | Source: Ambulatory Visit | Attending: Diagnostic Neuroimaging | Admitting: Diagnostic Neuroimaging

## 2017-10-27 DIAGNOSIS — G459 Transient cerebral ischemic attack, unspecified: Secondary | ICD-10-CM | POA: Diagnosis not present

## 2017-10-27 MED ORDER — GADOBENATE DIMEGLUMINE 529 MG/ML IV SOLN
16.0000 mL | Freq: Once | INTRAVENOUS | Status: AC | PRN
  Administered 2017-10-27: 16 mL via INTRAVENOUS

## 2017-11-01 ENCOUNTER — Telehealth: Payer: Self-pay | Admitting: Diagnostic Neuroimaging

## 2017-11-01 NOTE — Telephone Encounter (Signed)
MRA head / neck and TTE reviewed. No major findings. Continue current medications. No neurologic contraindication to surgery. -VRP

## 2017-11-01 NOTE — Telephone Encounter (Signed)
Your last note (it mentions pt having a lumpectomy) needing neurology clearance for this. She has tests that you have ordered for TIA work up.  Please advise.

## 2017-11-01 NOTE — Telephone Encounter (Signed)
Jill Contreras with [facility] is calling to get neurology clearance for the patient to have surgery.

## 2017-11-02 NOTE — Telephone Encounter (Signed)
Spoke to pt and relayed the results to pt per Dr. Domenica Fail below.  I relayed that I also spoke to Butch Penny in [place] to let them know as well.  She verbalized understanding.

## 2017-11-02 NOTE — Telephone Encounter (Signed)
Spoke to [place], at [place] and relayed that per Dr. Leta Baptist that the pt has clearance for surgery in Epic, or if needs other information to call us back.

## 2017-11-07 ENCOUNTER — Telehealth: Payer: Self-pay | Admitting: *Deleted

## 2017-11-07 NOTE — Telephone Encounter (Signed)
Spoke to pt and relayed that her MRA head and neck were unremarkable. No major findings.  Continue current plan.  Pt verbalized understanding.

## 2017-11-07 NOTE — Telephone Encounter (Signed)
-----   Message from Penni Bombard, MD sent at 11/05/2017  9:09 PM EST ----- Unremarkable imaging results. Please call patient. Continue current plan. -VRP

## 2017-11-08 DIAGNOSIS — I10 Essential (primary) hypertension: Secondary | ICD-10-CM | POA: Diagnosis not present

## 2017-11-08 DIAGNOSIS — Z23 Encounter for immunization: Secondary | ICD-10-CM | POA: Diagnosis not present

## 2017-11-25 DIAGNOSIS — N6091 Unspecified benign mammary dysplasia of right breast: Secondary | ICD-10-CM

## 2017-11-25 HISTORY — DX: Unspecified benign mammary dysplasia of right breast: N60.91

## 2017-11-28 ENCOUNTER — Other Ambulatory Visit: Payer: Self-pay

## 2017-11-28 ENCOUNTER — Encounter (HOSPITAL_BASED_OUTPATIENT_CLINIC_OR_DEPARTMENT_OTHER): Payer: Self-pay | Admitting: *Deleted

## 2017-11-28 NOTE — Pre-Procedure Instructions (Signed)
To come for BMET and to pick up Ensure pre-surgery drink 10 oz. - to drink by 0900 DOS.

## 2017-11-29 ENCOUNTER — Encounter (HOSPITAL_BASED_OUTPATIENT_CLINIC_OR_DEPARTMENT_OTHER)
Admission: RE | Admit: 2017-11-29 | Discharge: 2017-11-29 | Disposition: A | Discharge status: Home / Self Care | Payer: Medicare Other | Source: Ambulatory Visit | Attending: General Surgery | Admitting: General Surgery

## 2017-11-29 DIAGNOSIS — Z01812 Encounter for preprocedural laboratory examination: Secondary | ICD-10-CM | POA: Diagnosis not present

## 2017-11-29 DIAGNOSIS — I10 Essential (primary) hypertension: Secondary | ICD-10-CM | POA: Diagnosis not present

## 2017-11-29 LAB — BASIC METABOLIC PANEL
Anion gap: 11 (ref 5–15)
BUN: 12 mg/dL (ref 6–20)
CHLORIDE: 99 mmol/L — AB (ref 101–111)
CO2: 27 mmol/L (ref 22–32)
Calcium: 9.3 mg/dL (ref 8.9–10.3)
Creatinine, Ser: 0.56 mg/dL (ref 0.44–1.00)
GFR calc non Af Amer: >60 mL/min (ref >60)
Glucose, Bld: 142 mg/dL — ABNORMAL HIGH (ref 65–99)
POTASSIUM: 4.1 mmol/L (ref 3.5–5.1)
SODIUM: 137 mmol/L (ref 135–145)

## 2017-11-29 NOTE — Progress Notes (Signed)
Ensure pre surgery drink given with instructions, pt verbalized understanding. 

## 2017-11-30 DIAGNOSIS — N6091 Unspecified benign mammary dysplasia of right breast: Secondary | ICD-10-CM | POA: Diagnosis not present

## 2017-12-01 ENCOUNTER — Other Ambulatory Visit: Payer: Self-pay | Admitting: General Surgery

## 2017-12-01 DIAGNOSIS — N6091 Unspecified benign mammary dysplasia of right breast: Secondary | ICD-10-CM

## 2017-12-02 NOTE — H&P (Signed)
Jill Contreras Location: [facility] Patient #: [id number] DOB: September 22, 1946 Married / Language: English / Race: White Female      History of Present Illness       The patient is a 71 year old female who presents with a complaint of atypical ductal hyperplasia right breast. This is a 70 year old female, referred by Dr. Marcelo Baldy at [facility] mammography for evaluation of atypical ductal hyperplasia right breast. Dr. Lucianne Lei is her PCP. She has a neurology consult pending      She has no prior breast problems. Recent screening mammography shows an area of grouped calcifications in the right breast upper outer quadrant, posterior depth. She has some benign appearing vascular calcifications medially but this area is more lateral and more suspicious. Image guided biopsy was performed and shows atypical ductal hyperplasia. This procedure was performed on July 24, 2017.      Comorbidities include an emergency department visit on August 02, 2017 because of transient left arm paresthesias. MRI of the brain was negative. Question whether she had TIA. Dr. Criss Rosales has referred her to a neurologist for that consult has not been done yet. Non-insulin-dependent diabetes. Sleep apnea on BiPAP device. Hyperlipidemia. Hypertension. Hypothyroidism. No prior breast problems. Family history reveals mother diagnosed with breast cancer age 29. Survive that. Subsequent developed lymphoma, colon cancer, and squamous cell carcinoma to the and died age 52. Maternal first cousin diagnosed with breast cancer age 3 and remains a survivor 2 years later. Father died of a stroke and may have had carotid disease Shows this reveals she's been a widow for 17 years. Lives alone. Lives in [place]. Has 4 children one lives. Close. Denies alcohol or tobacco.      I advised her to have this area conservatively excised with a right nerve damage, react pulmonary neurologic and thromboembolic problems. She  understands all of these issues well. All her questions were answered. She agrees with this plan.       I told her that we would need to have the neurology consultation completed to make sure that her risk of general anesthesia was acceptable from a neurologic standpoint. She understands this. She is going to try to expedite the neurologic consultation.    Past Surgical History  Breast Biopsy  Right. Cataract Surgery  Bilateral. Knee Surgery  Left.  Diagnostic Studies History  Colonoscopy  1-5 years ago Mammogram  within last year Pap Smear  1-5 years ago  Allergies  Penicillin G Benzathine & Proc *PENICILLINS*  Allergies Reconciled   Medication History  Atorvastatin Calcium (80MG  Tablet, Oral) Active. Ezetimibe (10MG  Tablet, Oral) Active. Januvia (100MG  Tablet, Oral) Active. Levothyroxine Sodium (100MCG Tablet, Oral) Active. Lisinopril (10MG  Tablet, Oral) Active. MetFORMIN HCl (1000MG  Tablet, Oral) Active. Omeprazole (20MG  Capsule DR, Oral) Active. Pramipexole Dihydrochloride (1MG  Tablet, Oral) Active. Medications Reconciled  Social History  Caffeine use  Carbonated beverages, Coffee, Tea. No alcohol use  No drug use  Tobacco use  Never smoker.  Family History  Alcohol Abuse  Brother. Breast Cancer  Mother. Cancer  Mother. Colon Cancer  Mother. Colon Polyps  Son. Heart Disease  Father.  Pregnancy / Birth History Age at menarche  81 years. Age of menopause  77-55 Gravida  4 Length (months) of breastfeeding  7-12 Maternal age  74-25 Para  74  Other Problems  Arthritis  Bladder Problems  Cerebrovascular Accident  Diabetes Mellitus  Gastroesophageal Reflux Disease  High blood pressure  Hypercholesterolemia  Lump In Breast  Sleep Apnea  Thyroid Disease  Umbilical Hernia Repair     Review of Systems  General Present- Fatigue. Not Present- Appetite Loss, Chills, Fever, Night Sweats, Weight Gain and Weight  Loss. Skin Present- Dryness. Not Present- Change in Wart/Mole, Hives, Jaundice, New Lesions, Non-Healing Wounds, Rash and Ulcer. HEENT Not Present- Earache, Hearing Loss, Hoarseness, Nose Bleed, Oral Ulcers, Ringing in the Ears, Seasonal Allergies, Sinus Pain, Sore Throat, Visual Disturbances, Wears glasses/contact lenses and Yellow Eyes. Respiratory Present- Snoring. Not Present- Bloody sputum, Chronic Cough, Difficulty Breathing and Wheezing. Breast Present- Breast Mass. Not Present- Breast Pain, Nipple Discharge and Skin Changes. Cardiovascular Not Present- Chest Pain, Difficulty Breathing Lying Down, Leg Cramps, Palpitations, Rapid Heart Rate, Shortness of Breath and Swelling of Extremities. Gastrointestinal Present- Indigestion. Not Present- Abdominal Pain, Bloating, Bloody Stool, Change in Bowel Habits, Chronic diarrhea, Constipation, Difficulty Swallowing, Excessive gas, Gets full quickly at meals, Hemorrhoids, Nausea, Rectal Pain and Vomiting. Female Genitourinary Present- Frequency and Urgency. Not Present- Nocturia, Painful Urination and Pelvic Pain. Musculoskeletal Present- Joint Pain and Joint Stiffness. Not Present- Back Pain, Muscle Pain, Muscle Weakness and Swelling of Extremities. Neurological Present- Numbness. Not Present- Decreased Memory, Fainting, Headaches, Seizures, Tingling, Tremor, Trouble walking and Weakness. Psychiatric Not Present- Anxiety, Bipolar, Change in Sleep Pattern, Depression, Fearful and Frequent crying. Hematology Present- Blood Thinners. Not Present- Easy Bruising, Excessive bleeding, Gland problems, HIV and Persistent Infections.  Vitals  Weight: 173 lb Height: 65in Body Surface Area: 1.86 m Body Mass Index: 28.79 kg/m  Temp.: 97.32F  Pulse: 111 (Regular)  P.OX: 97% (Room air) BP: 128/80 (Sitting, Left Arm, Standard)       Physical Exam  General Mental Status-Alert. General Appearance-Consistent with stated age. Hydration-Well  hydrated. Voice-Normal.  Head and Neck Head-normocephalic, atraumatic with no lesions or palpable masses. Trachea-midline. Thyroid Gland Characteristics - normal size and consistency.  Eye Eyeball - Bilateral-Extraocular movements intact. Sclera/Conjunctiva - Bilateral-No scleral icterus.  Chest and Lung Exam Chest and lung exam reveals -quiet, even and easy respiratory effort with no use of accessory muscles and on auscultation, normal breath sounds, no adventitious sounds and normal vocal resonance. Inspection Chest Wall - Normal. Back - normal.  Breast Note: Breast are medium size. There is a tiny biopsy site in the upper outer quadrant of the right breast. There is no palpable mass in either breast. No hematoma. Nipple and areolar complexes looked normal without drainage. No axillary adenopathy.   Cardiovascular Cardiovascular examination reveals -normal heart sounds, regular rate and rhythm with no murmurs and normal pedal pulses bilaterally. Note: Listened carefully and did not hear any carotid bruits on either side   Abdomen Inspection  Inspection of the abdomen reveals: Note: She has an umbilical hernia. This is chronic. The hernia sac is at least 2 cm. The skin looks healthy. Partially reducible but I did not push that hard. Skin - Scar - no surgical scars. Palpation/Percussion Palpation and Percussion of the abdomen reveal - Soft, Non Tender, No Rebound tenderness, No Rigidity (guarding) and No hepatosplenomegaly. Auscultation Auscultation of the abdomen reveals - Bowel sounds normal.  Neurologic Neurologic evaluation reveals -alert and oriented x 3 with no impairment of recent or remote memory. Mental Status-Normal.  Musculoskeletal Normal Exam - Left-Upper Extremity Strength Normal and Lower Extremity Strength Normal. Normal Exam - Right-Upper Extremity Strength Normal and Lower Extremity Strength Normal.  Lymphatic Head &  Neck  General Head & Neck Lymphatics: Bilateral - Description - Normal. Axillary  General Axillary Region: Bilateral - Description - Normal. Tenderness - Non  Tender. Femoral & Inguinal  Generalized Femoral & Inguinal Lymphatics: Bilateral - Description - Normal. Tenderness - Non Tender.    Assessment & Plan ATYPICAL DUCTAL HYPERPLASIA OF RIGHT BREAST (N60.91)       Your recent imaging studies show a focal area of abnormality in the right breast, upper outer quadrant The needle guided core biopsy of this area that was performed at [facility] mammography shows atypical ductal hyperplasia No cancer cells were seen However, there is a possibility of cancer being present right now, and that risk is about 15% or slightly higher      We recommend right breast lumpectomy with radioactive seed localization We have discussed the indications, techniques, and risks of the surgery in detail       We will schedule this surgery as soon as possible once your neurology workup for left arm numbness has been completed   ARM PARESTHESIA, LEFT (R20.2) Impression: Dr. Criss Rosales, her PCP, has referred her to a neurologist. [place] (Z80.3) TYPE 2 DIABETES MELLITUS TREATED WITHOUT INSULIN (E11.9) SLEEP APNEA IN ADULT (G47.30) Impression: Uses device HYPERLIPIDEMIA, ACQUIRED (E78.5)    Jill Contreras, M.D., [facility] General and Minimally invasive Surgery Breast and Colorectal Surgery Office:   [phone] Pager:   [phone]

## 2017-12-04 ENCOUNTER — Ambulatory Visit (HOSPITAL_COMMUNITY)
Admission: RE | Admit: 2017-12-04 | Discharge: 2017-12-04 | Disposition: A | Discharge status: Home / Self Care | Payer: Medicare Other | Source: Ambulatory Visit | Attending: General Surgery | Admitting: General Surgery

## 2017-12-04 ENCOUNTER — Ambulatory Visit (HOSPITAL_COMMUNITY): Payer: Medicare Other | Admitting: Certified Registered Nurse Anesthetist

## 2017-12-04 ENCOUNTER — Encounter (HOSPITAL_COMMUNITY): Payer: Self-pay

## 2017-12-04 ENCOUNTER — Encounter (HOSPITAL_COMMUNITY)
Admission: RE | Disposition: A | Discharge status: Home / Self Care | Payer: Self-pay | Source: Ambulatory Visit | Attending: General Surgery

## 2017-12-04 ENCOUNTER — Other Ambulatory Visit: Payer: Self-pay

## 2017-12-04 DIAGNOSIS — Z823 Family history of stroke: Secondary | ICD-10-CM | POA: Insufficient documentation

## 2017-12-04 DIAGNOSIS — E119 Type 2 diabetes mellitus without complications: Secondary | ICD-10-CM | POA: Diagnosis not present

## 2017-12-04 DIAGNOSIS — G4730 Sleep apnea, unspecified: Secondary | ICD-10-CM | POA: Diagnosis not present

## 2017-12-04 DIAGNOSIS — R921 Mammographic calcification found on diagnostic imaging of breast: Secondary | ICD-10-CM | POA: Diagnosis not present

## 2017-12-04 DIAGNOSIS — Z803 Family history of malignant neoplasm of breast: Secondary | ICD-10-CM | POA: Insufficient documentation

## 2017-12-04 DIAGNOSIS — Z880 Allergy status to penicillin: Secondary | ICD-10-CM | POA: Insufficient documentation

## 2017-12-04 DIAGNOSIS — K219 Gastro-esophageal reflux disease without esophagitis: Secondary | ICD-10-CM | POA: Diagnosis not present

## 2017-12-04 DIAGNOSIS — Z79899 Other long term (current) drug therapy: Secondary | ICD-10-CM | POA: Insufficient documentation

## 2017-12-04 DIAGNOSIS — I10 Essential (primary) hypertension: Secondary | ICD-10-CM | POA: Insufficient documentation

## 2017-12-04 DIAGNOSIS — E039 Hypothyroidism, unspecified: Secondary | ICD-10-CM | POA: Diagnosis not present

## 2017-12-04 DIAGNOSIS — N6091 Unspecified benign mammary dysplasia of right breast: Secondary | ICD-10-CM | POA: Insufficient documentation

## 2017-12-04 DIAGNOSIS — N6081 Other benign mammary dysplasias of right breast: Secondary | ICD-10-CM | POA: Diagnosis not present

## 2017-12-04 DIAGNOSIS — E785 Hyperlipidemia, unspecified: Secondary | ICD-10-CM | POA: Insufficient documentation

## 2017-12-04 DIAGNOSIS — N6011 Diffuse cystic mastopathy of right breast: Secondary | ICD-10-CM | POA: Diagnosis not present

## 2017-12-04 HISTORY — DX: Hyperlipidemia, unspecified: E78.5

## 2017-12-04 HISTORY — DX: Personal history of transient ischemic attack (TIA), and cerebral infarction without residual deficits: Z86.73

## 2017-12-04 HISTORY — DX: Presence of dental prosthetic device (complete) (partial): Z97.2

## 2017-12-04 HISTORY — DX: Unspecified osteoarthritis, unspecified site: M19.90

## 2017-12-04 HISTORY — DX: Heartburn: R12

## 2017-12-04 HISTORY — DX: Restless legs syndrome: G25.81

## 2017-12-04 HISTORY — DX: Hypothyroidism, unspecified: E03.9

## 2017-12-04 HISTORY — DX: Cystocele, unspecified: N81.10

## 2017-12-04 HISTORY — DX: Dental restoration status: Z98.811

## 2017-12-04 HISTORY — DX: Other allergy status, other than to drugs and biological substances: Z91.09

## 2017-12-04 HISTORY — DX: Type 2 diabetes mellitus without complications: E11.9

## 2017-12-04 HISTORY — DX: Unspecified benign mammary dysplasia of right breast: N60.91

## 2017-12-04 HISTORY — PX: BREAST LUMPECTOMY WITH RADIOACTIVE SEED LOCALIZATION: SHX6424

## 2017-12-04 LAB — CBC WITH DIFFERENTIAL/PLATELET
Basophils Absolute: 0 10*3/uL (ref 0.0–0.1)
Basophils Relative: 0 %
Eosinophils Absolute: 0.2 10*3/uL (ref 0.0–0.7)
Eosinophils Relative: 3 %
HEMATOCRIT: 35.1 % — AB (ref 36.0–46.0)
HEMOGLOBIN: 11.3 g/dL — AB (ref 12.0–15.0)
LYMPHS PCT: 30 %
Lymphs Abs: 2.3 10*3/uL (ref 0.7–4.0)
MCH: 26.8 pg (ref 26.0–34.0)
MCHC: 32.2 g/dL (ref 30.0–36.0)
MCV: 83.2 fL (ref 78.0–100.0)
MONOS PCT: 4 %
Monocytes Absolute: 0.3 10*3/uL (ref 0.1–1.0)
NEUTROS ABS: 4.6 10*3/uL (ref 1.7–7.7)
NEUTROS PCT: 63 %
Platelets: 261 10*3/uL (ref 150–400)
RBC: 4.22 MIL/uL (ref 3.87–5.11)
RDW: 14.6 % (ref 11.5–15.5)
WBC: 7.4 10*3/uL (ref 4.0–10.5)

## 2017-12-04 LAB — COMPREHENSIVE METABOLIC PANEL
ALBUMIN: 3.9 g/dL (ref 3.5–5.0)
ALK PHOS: 61 U/L (ref 38–126)
ALT: 21 U/L (ref 14–54)
ANION GAP: 10 (ref 5–15)
AST: 29 U/L (ref 15–41)
BILIRUBIN TOTAL: 1.2 mg/dL (ref 0.3–1.2)
BUN: 21 mg/dL — ABNORMAL HIGH (ref 6–20)
CALCIUM: 9.3 mg/dL (ref 8.9–10.3)
CO2: 24 mmol/L (ref 22–32)
Chloride: 100 mmol/L — ABNORMAL LOW (ref 101–111)
Creatinine, Ser: 0.69 mg/dL (ref 0.44–1.00)
GLUCOSE: 339 mg/dL — AB (ref 65–99)
Potassium: 3.9 mmol/L (ref 3.5–5.1)
Sodium: 134 mmol/L — ABNORMAL LOW (ref 135–145)
TOTAL PROTEIN: 6.9 g/dL (ref 6.5–8.1)

## 2017-12-04 LAB — GLUCOSE, CAPILLARY
GLUCOSE-CAPILLARY: 133 mg/dL — AB (ref 65–99)
Glucose-Capillary: 311 mg/dL — ABNORMAL HIGH (ref 65–99)
Glucose-Capillary: 147 mg/dL — ABNORMAL HIGH (ref 65–99)

## 2017-12-04 SURGERY — BREAST LUMPECTOMY WITH RADIOACTIVE SEED LOCALIZATION
Anesthesia: General | Site: Breast | Laterality: Right

## 2017-12-04 MED ORDER — ACETAMINOPHEN 500 MG PO TABS
1000.0000 mg | ORAL_TABLET | ORAL | Status: AC
  Administered 2017-12-04: 1000 mg via ORAL

## 2017-12-04 MED ORDER — KETAMINE HCL-SODIUM CHLORIDE 100-0.9 MG/10ML-% IV SOSY
PREFILLED_SYRINGE | INTRAVENOUS | Status: AC
  Filled 2017-12-04: qty 10

## 2017-12-04 MED ORDER — DEXAMETHASONE SODIUM PHOSPHATE 10 MG/ML IJ SOLN
INTRAMUSCULAR | Status: DC | PRN
  Administered 2017-12-04: 4 mg via INTRAVENOUS

## 2017-12-04 MED ORDER — VANCOMYCIN HCL 1000 MG IV SOLR
INTRAVENOUS | Status: DC | PRN
  Administered 2017-12-04: 1000 mg via INTRAVENOUS

## 2017-12-04 MED ORDER — FENTANYL CITRATE (PF) 100 MCG/2ML IJ SOLN
INTRAMUSCULAR | Status: DC | PRN
  Administered 2017-12-04: 25 ug via INTRAVENOUS
  Administered 2017-12-04 (×2): 50 ug via INTRAVENOUS

## 2017-12-04 MED ORDER — ACETAMINOPHEN 500 MG PO TABS
ORAL_TABLET | ORAL | Status: AC
  Administered 2017-12-04: 1000 mg via ORAL
  Filled 2017-12-04: qty 2

## 2017-12-04 MED ORDER — BUPIVACAINE-EPINEPHRINE (PF) 0.25% -1:200000 IJ SOLN
INTRAMUSCULAR | Status: AC
  Filled 2017-12-04: qty 30

## 2017-12-04 MED ORDER — LIDOCAINE 2% (20 MG/ML) 5 ML SYRINGE
INTRAMUSCULAR | Status: DC | PRN
  Administered 2017-12-04: 100 mg via INTRAVENOUS

## 2017-12-04 MED ORDER — GABAPENTIN 300 MG PO CAPS
ORAL_CAPSULE | ORAL | Status: AC
  Administered 2017-12-04: 300 mg via ORAL
  Filled 2017-12-04: qty 1

## 2017-12-04 MED ORDER — HYDROMORPHONE HCL 1 MG/ML IJ SOLN
0.2500 mg | INTRAMUSCULAR | Status: DC | PRN
  Administered 2017-12-04 (×2): 0.25 mg via INTRAVENOUS

## 2017-12-04 MED ORDER — MEPERIDINE HCL 25 MG/ML IJ SOLN: 6.2500 mg | INTRAMUSCULAR | Status: DC | PRN

## 2017-12-04 MED ORDER — EPHEDRINE SULFATE-NACL 50-0.9 MG/10ML-% IV SOSY
PREFILLED_SYRINGE | INTRAVENOUS | Status: DC | PRN
  Administered 2017-12-04: 10 mg via INTRAVENOUS

## 2017-12-04 MED ORDER — FENTANYL CITRATE (PF) 250 MCG/5ML IJ SOLN
INTRAMUSCULAR | Status: AC
  Filled 2017-12-04: qty 5

## 2017-12-04 MED ORDER — 0.9 % SODIUM CHLORIDE (POUR BTL) OPTIME
TOPICAL | Status: DC | PRN
  Administered 2017-12-04: 1000 mL

## 2017-12-04 MED ORDER — ONDANSETRON HCL 4 MG/2ML IJ SOLN: 4.0000 mg | Freq: Once | INTRAMUSCULAR | Status: DC | PRN

## 2017-12-04 MED ORDER — LIDOCAINE 2% (20 MG/ML) 5 ML SYRINGE
INTRAMUSCULAR | Status: AC
  Filled 2017-12-04: qty 5

## 2017-12-04 MED ORDER — LACTATED RINGERS IV SOLN
INTRAVENOUS | Status: DC
  Administered 2017-12-04: 10:00:00 via INTRAVENOUS

## 2017-12-04 MED ORDER — CHLORHEXIDINE GLUCONATE CLOTH 2 % EX PADS: 6.0000 | MEDICATED_PAD | Freq: Once | CUTANEOUS | Status: DC

## 2017-12-04 MED ORDER — ARTIFICIAL TEARS OPHTHALMIC OINT
TOPICAL_OINTMENT | OPHTHALMIC | Status: AC
  Filled 2017-12-04: qty 3.5

## 2017-12-04 MED ORDER — BUPIVACAINE-EPINEPHRINE 0.25% -1:200000 IJ SOLN
INTRAMUSCULAR | Status: DC | PRN
  Administered 2017-12-04: 10 mL

## 2017-12-04 MED ORDER — PROPOFOL 10 MG/ML IV BOLUS
INTRAVENOUS | Status: DC | PRN
  Administered 2017-12-04: 150 mg via INTRAVENOUS

## 2017-12-04 MED ORDER — PHENYLEPHRINE 40 MCG/ML (10ML) SYRINGE FOR IV PUSH (FOR BLOOD PRESSURE SUPPORT)
PREFILLED_SYRINGE | INTRAVENOUS | Status: DC | PRN
  Administered 2017-12-04: 80 ug via INTRAVENOUS
  Administered 2017-12-04: 120 ug via INTRAVENOUS

## 2017-12-04 MED ORDER — DEXAMETHASONE SODIUM PHOSPHATE 10 MG/ML IJ SOLN
INTRAMUSCULAR | Status: AC
  Filled 2017-12-04: qty 2

## 2017-12-04 MED ORDER — VANCOMYCIN HCL IN DEXTROSE 1-5 GM/200ML-% IV SOLN: 1000.0000 mg | INTRAVENOUS | Status: DC

## 2017-12-04 MED ORDER — ONDANSETRON HCL 4 MG/2ML IJ SOLN
INTRAMUSCULAR | Status: DC | PRN
  Administered 2017-12-04: 4 mg via INTRAVENOUS

## 2017-12-04 MED ORDER — VANCOMYCIN HCL IN DEXTROSE 1-5 GM/200ML-% IV SOLN
INTRAVENOUS | Status: AC
  Filled 2017-12-04: qty 200

## 2017-12-04 MED ORDER — ONDANSETRON HCL 4 MG/2ML IJ SOLN
INTRAMUSCULAR | Status: AC
  Filled 2017-12-04: qty 2

## 2017-12-04 MED ORDER — HYDROMORPHONE HCL 1 MG/ML IJ SOLN
INTRAMUSCULAR | Status: AC
  Filled 2017-12-04: qty 1

## 2017-12-04 MED ORDER — GABAPENTIN 300 MG PO CAPS
300.0000 mg | ORAL_CAPSULE | ORAL | Status: AC
  Administered 2017-12-04: 300 mg via ORAL

## 2017-12-04 MED ORDER — HYDROCODONE-ACETAMINOPHEN 5-325 MG PO TABS: 1.0000 | ORAL_TABLET | Freq: Four times a day (QID) | ORAL | 0 refills | Status: DC | PRN

## 2017-12-04 SURGICAL SUPPLY — 46 items
APPLIER CLIP 9.375 MED OPEN (MISCELLANEOUS) ×3
BINDER BREAST LRG (GAUZE/BANDAGES/DRESSINGS) ×3 IMPLANT
BINDER BREAST XLRG (GAUZE/BANDAGES/DRESSINGS) IMPLANT
BLADE SURG 15 STRL LF DISP TIS (BLADE) ×1 IMPLANT
BLADE SURG 15 STRL SS (BLADE) ×2
CANISTER SUCT 3000ML PPV (MISCELLANEOUS) ×3 IMPLANT
CHLORAPREP W/TINT 26ML (MISCELLANEOUS) ×3 IMPLANT
CLIP APPLIE 9.375 MED OPEN (MISCELLANEOUS) ×1 IMPLANT
COVER PROBE W GEL 5X96 (DRAPES) ×3 IMPLANT
COVER SURGICAL LIGHT HANDLE (MISCELLANEOUS) ×3 IMPLANT
DERMABOND ADVANCED (GAUZE/BANDAGES/DRESSINGS) ×2
DERMABOND ADVANCED .7 DNX12 (GAUZE/BANDAGES/DRESSINGS) ×1 IMPLANT
DEVICE DUBIN SPECIMEN MAMMOGRA (MISCELLANEOUS) ×3 IMPLANT
DRAPE CHEST BREAST 15X10 FENES (DRAPES) ×3 IMPLANT
DRAPE UTILITY XL STRL (DRAPES) ×3 IMPLANT
DRSG PAD ABDOMINAL 8X10 ST (GAUZE/BANDAGES/DRESSINGS) ×3 IMPLANT
ELECT CAUTERY BLADE 6.4 (BLADE) ×3 IMPLANT
ELECT REM PT RETURN 9FT ADLT (ELECTROSURGICAL) ×3
ELECTRODE REM PT RTRN 9FT ADLT (ELECTROSURGICAL) ×1 IMPLANT
GAUZE SPONGE 4X4 12PLY STRL (GAUZE/BANDAGES/DRESSINGS) ×3 IMPLANT
GAUZE SPONGE 4X4 12PLY STRL LF (GAUZE/BANDAGES/DRESSINGS) ×3 IMPLANT
GLOVE EUDERMIC 7 POWDERFREE (GLOVE) ×6 IMPLANT
GOWN STRL REUS W/ TWL LRG LVL3 (GOWN DISPOSABLE) ×1 IMPLANT
GOWN STRL REUS W/ TWL XL LVL3 (GOWN DISPOSABLE) ×1 IMPLANT
GOWN STRL REUS W/TWL LRG LVL3 (GOWN DISPOSABLE) ×2
GOWN STRL REUS W/TWL XL LVL3 (GOWN DISPOSABLE) ×2
ILLUMINATOR WAVEGUIDE N/F (MISCELLANEOUS) IMPLANT
KIT BASIN OR (CUSTOM PROCEDURE TRAY) ×3 IMPLANT
KIT MARKER MARGIN INK (KITS) ×3 IMPLANT
LIGHT WAVEGUIDE WIDE FLAT (MISCELLANEOUS) IMPLANT
NEEDLE HYPO 25GX1X1/2 BEV (NEEDLE) ×3 IMPLANT
NS IRRIG 1000ML POUR BTL (IV SOLUTION) ×3 IMPLANT
PACK SURGICAL SETUP 50X90 (CUSTOM PROCEDURE TRAY) ×3 IMPLANT
PAD ABD 8X10 STRL (GAUZE/BANDAGES/DRESSINGS) ×3 IMPLANT
PENCIL BUTTON HOLSTER BLD 10FT (ELECTRODE) ×3 IMPLANT
SPONGE LAP 4X18 X RAY DECT (DISPOSABLE) ×3 IMPLANT
SUT MNCRL AB 4-0 PS2 18 (SUTURE) ×3 IMPLANT
SUT SILK 2 0 SH (SUTURE) ×3 IMPLANT
SUT VIC AB 3-0 SH 18 (SUTURE) ×3 IMPLANT
SYR BULB 3OZ (MISCELLANEOUS) ×3 IMPLANT
SYR CONTROL 10ML LL (SYRINGE) ×3 IMPLANT
TOWEL OR 17X24 6PK STRL BLUE (TOWEL DISPOSABLE) ×3 IMPLANT
TOWEL OR 17X26 10 PK STRL BLUE (TOWEL DISPOSABLE) ×3 IMPLANT
TUBE CONNECTING 12'X1/4 (SUCTIONS) ×1
TUBE CONNECTING 12X1/4 (SUCTIONS) ×2 IMPLANT
YANKAUER SUCT BULB TIP NO VENT (SUCTIONS) ×3 IMPLANT

## 2017-12-04 NOTE — Op Note (Signed)
Patient Name:           Jill Contreras   Date of Surgery:        12/04/2017  Pre op Diagnosis:      Atypical ductal hyperplasia right breast  Post op Diagnosis:    Same  Procedure:                 Right breast lumpectomy with radioactive seed localization  Surgeon:                     Edsel Petrin. Dalbert Batman, M.D., FACS  Assistant:                      OR staff  Operative Indications:     This is a 71 year old female, referred by Dr. Marcelo Baldy at [facility] mammography for evaluation of atypical ductal hyperplasia right breast. Dr. Lucianne Lei is her PCP.       She has no prior breast problems. Recent screening mammography shows an area of grouped calcifications in the right breast upper outer quadrant, posterior depth. She has some benign appearing vascular calcifications medially but this area is more lateral and more suspicious. Image guided biopsy was performed and shows atypical ductal hyperplasia. This procedure was performed on July 24, 2017.      Comorbidities include an emergency department visit on August 02, 2017 because of transient left arm paresthesias. MRI of the brain was negative. Question whether she had TIA. Dr. Criss Rosales has referred her to a neurologist and that workup has been completed with no diagnostic findings PA she has been cleared for general anesthesia.. Non-insulin-dependent diabetes. Sleep apnea on BiPAP device. Hyperlipidemia. Hypertension. Hypothyroidism. Family history reveals mother diagnosed with breast cancer age 34. Survived . She subsequently developed lymphoma, colon cancer, and squamous cell carcinoma and  died age 33. Maternal first cousin diagnosed with breast cancer age 56 and remains a survivor 2 years later. Father died of a stroke and may have had carotid disease Shows this reveals she's been a widow for 17 years. Lives alone.       I advised her to have this area conservatively excised with a RSL indications, techniques, and risk were  discussed in detail.  She agrees with this plan.Marland Kitchen   Operative Findings:       The radioactive seed and original marker clip were in the far lateral right breast at about the 9:30 position.  The specimen mammogram looked very good with the radioactive seed and the marker clip in the center of the specimen.  There was no grossly abnormal palpable finding.  Procedure in Detail:          Following the induction of general LMA anesthesia the patient's right breast was prepped and draped in a sterile fashion.  Intravenous antibiotics were given.  Surgical timeout was performed.  0.5% Marcaine with epinephrine was used as local infiltration anesthetic.     The radioactive signal was in the lateral right breast.  I made a curvilinear incision in the far lateral right breast.  Incision was made with the knife.  Lumpectomy was performed using the neoprobe and electrocautery.  The specimen was removed and marked with silk sutures and a 6 color ink kit to orient the pathologist.  The specimen mammogram looked very good and the specimen was sent to the lab where the seed was retrieved.  Hemostasis was excellent and achieved with electrocautery.  The wound was irrigated.  The walls  of the lumpectomy cavity were marked with 5 metal marker clips.  The breast tissues were reapproximated with 3-0 Vicryl sutures and the skin closed with a running subcuticular 4-0 Monocryl and Dermabond.  Dry bandages and a breast binder were placed.  The patient tolerated the procedure well was taken to PACU in stable condition.  EBL 10 mL.  Counts correct.  Complications none.     Edsel Petrin. Dalbert Batman, M.D., FACS General and Minimally Invasive Surgery Breast and Colorectal Surgery    Addendum: I logged onto the [place] website and reviewed her prescription medication history    12/04/2017 2:33 PM

## 2017-12-04 NOTE — Transfer of Care (Signed)
Immediate Anesthesia Transfer of Care Note  Patient: Jill Contreras  Procedure(s) Performed: RIGHT BREAST LUMPECTOMY WITH RADIOACTIVE SEED LOCALIZATION ERAS PATHWAY (Right Breast)  Patient Location: PACU  Anesthesia Type:General  Level of Consciousness: awake, alert , oriented and patient cooperative  Airway & Oxygen Therapy: Patient Spontanous Breathing  Post-op Assessment: Report given to RN, Post -op Vital signs reviewed and stable and Patient moving all extremities X 4  Post vital signs: Reviewed and stable  Last Vitals:  Vitals:   12/04/17 0954 12/04/17 1438  BP: (!) 109/46   Pulse: 78   Resp: 17   Temp: 36.7 C (!) (P) 36.3 C  SpO2: 95%     Last Pain:  Vitals:   12/04/17 0954  TempSrc: Oral      Patients Stated Pain Goal: 3 (75/64/33 2951)  Complications: No apparent anesthesia complications

## 2017-12-04 NOTE — Progress Notes (Signed)
Dr. Conrad [place] made aware of pt's BS of 311.

## 2017-12-04 NOTE — Anesthesia Postprocedure Evaluation (Signed)
Anesthesia Post Note  Patient: Jill Contreras  Procedure(s) Performed: RIGHT BREAST LUMPECTOMY WITH RADIOACTIVE SEED LOCALIZATION ERAS PATHWAY (Right Breast)     Patient location during evaluation: PACU Anesthesia Type: General Level of consciousness: awake and alert Pain management: pain level controlled Vital Signs Assessment: post-procedure vital signs reviewed and stable Respiratory status: spontaneous breathing, nonlabored ventilation, respiratory function stable and patient connected to nasal cannula oxygen Cardiovascular status: blood pressure returned to baseline and stable Postop Assessment: no apparent nausea or vomiting Anesthetic complications: no    Last Vitals:  Vitals:   12/04/17 1530 12/04/17 1538  BP:  119/61  Pulse: 60 66  Resp: 14 14  Temp: (!) 36.2 C   SpO2: 96% 97%    Last Pain:  Vitals:   12/04/17 1530  TempSrc:   PainSc: 6                  [name] DAVID

## 2017-12-04 NOTE — Interval H&P Note (Signed)
History and Physical Interval Note:  12/04/2017 9:18 AM  Jill Contreras  has presented today for surgery, with the diagnosis of atypical ductal hyperplasia right breast  The various methods of treatment have been discussed with the patient and family. After consideration of risks, benefits and other options for treatment, the patient has consented to  Procedure(s): RIGHT BREAST LUMPECTOMY WITH RADIOACTIVE SEED LOCALIZATION ERAS PATHWAY (Right) as a surgical intervention .  The patient's history has been reviewed, patient examined, no change in status, stable for surgery.  I have reviewed the patient's chart and labs.  Questions were answered to the patient's satisfaction.     Adin Hector

## 2017-12-04 NOTE — Anesthesia Procedure Notes (Signed)
Procedure Name: LMA Insertion Date/Time: 12/04/2017 1:47 PM Performed by: Julieta Bellini, CRNA Pre-anesthesia Checklist: Patient identified, Emergency Drugs available, Suction available and Patient being monitored Patient Re-evaluated:Patient Re-evaluated prior to induction Oxygen Delivery Method: Circle system utilized Preoxygenation: Pre-oxygenation with 100% oxygen Induction Type: IV induction Ventilation: Mask ventilation without difficulty LMA: LMA inserted LMA Size: 4.0 Number of attempts: 1 Placement Confirmation: positive ETCO2 and breath sounds checked- equal and bilateral Tube secured with: Tape Dental Injury: Teeth and Oropharynx as per pre-operative assessment

## 2017-12-04 NOTE — Anesthesia Preprocedure Evaluation (Signed)
Anesthesia Evaluation  Patient identified by MRN, date of birth, ID band Patient awake    Reviewed: Allergy & Precautions, NPO status , Patient's Chart, lab work & pertinent test results  Airway Mallampati: I  TM Distance: >3 FB Neck ROM: Full    Dental   Pulmonary    Pulmonary exam normal        Cardiovascular hypertension, Pt. on medications Normal cardiovascular exam     Neuro/Psych    GI/Hepatic   Endo/Other  diabetes, Type 2, Oral Hypoglycemic Agents  Renal/GU      Musculoskeletal   Abdominal   Peds  Hematology   Anesthesia Other Findings   Reproductive/Obstetrics                             Anesthesia Physical Anesthesia Plan  ASA: II  Anesthesia Plan: General   Post-op Pain Management:    Induction: Intravenous  PONV Risk Score and Plan: 3 and Ondansetron, Dexamethasone and Midazolam  Airway Management Planned: LMA  Additional Equipment:   Intra-op Plan:   Post-operative Plan: Extubation in OR  Informed Consent: I have reviewed the patients History and Physical, chart, labs and discussed the procedure including the risks, benefits and alternatives for the proposed anesthesia with the patient or authorized representative who has indicated his/her understanding and acceptance.     Plan Discussed with: CRNA and Surgeon  Anesthesia Plan Comments:         Anesthesia Quick Evaluation

## 2017-12-04 NOTE — Discharge Instructions (Signed)
[facility] °Office Phone Number [phone] ° °BREAST BIOPSY/ PARTIAL MASTECTOMY: POST OP INSTRUCTIONS ° °Always review your discharge instruction sheet given to you by the facility where your surgery was performed. ° °IF YOU HAVE DISABILITY OR FAMILY LEAVE FORMS, YOU MUST BRING THEM TO THE OFFICE FOR PROCESSING.  DO NOT GIVE THEM TO YOUR DOCTOR. ° °1. A prescription for pain medication may be given to you upon discharge.  Take your pain medication as prescribed, if needed.  If narcotic pain medicine is not needed, then you may take acetaminophen (Tylenol) or ibuprofen (Advil) as needed. °2. Take your usually prescribed medications unless otherwise directed °3. If you need a refill on your pain medication, please contact your pharmacy.  They will contact our office to request authorization.  Prescriptions will not be filled after 5pm or on week-ends. °4. You should eat very light the first 24 hours after surgery, such as soup, crackers, pudding, etc.  Resume your normal diet the day after surgery. °5. Most patients will experience some swelling and bruising in the breast.  Ice packs and a good support bra will help.  Swelling and bruising can take several days to resolve.  °6. It is common to experience some constipation if taking pain medication after surgery.  Increasing fluid intake and taking a stool softener will usually help or prevent this problem from occurring.  A mild laxative (Milk of Magnesia or Miralax) should be taken according to package directions if there are no bowel movements after 48 hours. °7. Unless discharge instructions indicate otherwise, you may remove your bandages 24-48 hours after surgery, and you may shower at that time.  You may have steri-strips (small skin tapes) in place directly over the incision.  These strips should be left on the skin for 7-10 days.  If your surgeon used skin glue on the incision, you may shower in 24 hours.  The glue will flake off over the  next 2-3 weeks.  Any sutures or staples will be removed at the office during your follow-up visit. °8. ACTIVITIES:  You may resume regular daily activities (gradually increasing) beginning the next day.  Wearing a good support bra or sports bra minimizes pain and swelling.  You may have sexual intercourse when it is comfortable. °a. You may drive when you no longer are taking prescription pain medication, you can comfortably wear a seatbelt, and you can safely maneuver your car and apply brakes. °b. RETURN TO WORK:  ______________________________________________________________________________________ °9. You should see your doctor in the office for a follow-up appointment approximately two weeks after your surgery.  Your doctor’s nurse will typically make your follow-up appointment when she calls you with your pathology report.  Expect your pathology report 2-3 business days after your surgery.  You may call to check if you do not hear from us after three days. °10. OTHER INSTRUCTIONS: _______________________________________________________________________________________________ _____________________________________________________________________________________________________________________________________ °_____________________________________________________________________________________________________________________________________ °_____________________________________________________________________________________________________________________________________ ° °WHEN TO CALL YOUR DOCTOR: °1. Fever over 101.0 °2. Nausea and/or vomiting. °3. Extreme swelling or bruising. °4. Continued bleeding from incision. °5. Increased pain, redness, or drainage from the incision. ° °The clinic staff is available to answer your questions during regular business hours.  Please don’t hesitate to call and ask to speak to one of the nurses for clinical concerns.  If you have a medical emergency, go to the nearest  emergency room or call 911.  A surgeon from [facility] is always on call at the hospital. ° °For further questions, please visit [email or web address]  °

## 2017-12-05 ENCOUNTER — Encounter (HOSPITAL_COMMUNITY): Payer: Self-pay | Admitting: General Surgery

## 2017-12-06 NOTE — Progress Notes (Signed)
Inform patient of Pathology report,.  Tell her that the final breast pathology shows only atypical hyperplasia.  There was no cancer No further surgery will be required I will discuss this with her in detail at her next office visit  hmi

## 2017-12-26 HISTORY — PX: OTHER SURGICAL HISTORY: SHX169

## 2018-01-22 ENCOUNTER — Ambulatory Visit (INDEPENDENT_AMBULATORY_CARE_PROVIDER_SITE_OTHER): Payer: Medicare Other | Admitting: Diagnostic Neuroimaging

## 2018-01-22 ENCOUNTER — Encounter: Payer: Self-pay | Admitting: Diagnostic Neuroimaging

## 2018-01-22 VITALS — BP 146/73 | HR 75 | Ht 65.0 in | Wt 173.2 lb

## 2018-01-22 DIAGNOSIS — G459 Transient cerebral ischemic attack, unspecified: Secondary | ICD-10-CM

## 2018-01-22 NOTE — Patient Instructions (Signed)
-  continue current medications

## 2018-01-22 NOTE — Progress Notes (Signed)
[facility]  PATIENT: Jill Contreras DOB: Jan 23, 1946  REFERRING CLINICIAN: Clayburn Pert HISTORY FROM: patient and chart review REASON FOR VISIT: follow up    HISTORICAL  CHIEF COMPLAINT:  Chief Complaint  Patient presents with  . Follow-up  . Transient Ischemic Attack    go over test results.  Had 12/04/17 lumpectomy and was negative for CA.    HISTORY OF PRESENT ILLNESS:   UPDATE (01/22/18, VRP): Since last visit, doing well. Tolerating meds. No alleviating or aggravating factors. No recurrent symptoms.  PRIOR HPI (10/13/17): 72 year old female with hypertension, diabetes, hypercholesteremia, TIA, here for valuation of transient left arm and right face numbness.  08/02/17 patient was at home, her grandson, playing on the floor, when all of a sudden she broke out into a cold sweat, then felt numbness in her left hand and left arm. She also noticed some right face numbness. She had some nausea. This lasted approximately 12 hours. Patient went to the emergency room for evaluation. MRI of the brain was negative for acute ischemic infarction. Blood testing was negative. Patient followed up with PCP who recommended neurology follow-up.  Patient had a different episode in 2003 where she felt slurred speech and gait difficulty. Apparently she was evaluated in the hospital for possible TIA. Around that time patient had cough and cold symptoms and was taking a lot of cough medication.  Patient also has bilateral knee arthritis and is getting workup for possible knee replacement.  July 2018 patient had mammogram which detected an abnormality for which patient is scheduled to have lumpectomy. However the surgeon is awaiting neurology consultation today before proceeding with surgery.    REVIEW OF SYSTEMS: Full 14 system review of systems performed and negative with exception of: Frequent urination joint pain aching muscles restless legs insomnia  apnea.   ALLERGIES: Allergies  Allergen Reactions  . Mango Flavor Swelling    SWELLING OF FACE AND EYES  . Penicillins Other (See Comments)    SWELLING OF LIPS   . Lisinopril Cough    HOME MEDICATIONS: Outpatient Medications Prior to Visit  Medication Sig Dispense Refill  . aspirin EC 325 MG tablet Take 325 mg by mouth at bedtime.    Marland Kitchen atorvastatin (LIPITOR) 80 MG tablet Take 80 mg by mouth daily.    . carvedilol (COREG) 6.25 MG tablet Take 6.25 mg by mouth 2 (two) times daily with a meal.    . Dextromethorphan-Guaifenesin (CORICIDIN HBP CONGESTION/COUGH PO) Take by mouth.    . ENTRESTO 97-103 MG TK 1 T PO BID  3  . ezetimibe (ZETIA) 10 MG tablet Take 10 mg by mouth daily.    Marland Kitchen HYDROcodone-acetaminophen (NORCO) 5-325 MG tablet Take 1-2 tablets by mouth every 6 (six) hours as needed for moderate pain or severe pain. 20 tablet 0  . levothyroxine (SYNTHROID, LEVOTHROID) 100 MCG tablet Take 100 mcg by mouth daily before breakfast.    . metFORMIN (GLUCOPHAGE) 1000 MG tablet Take 500 mg by mouth 3 (three) times daily.     Marland Kitchen omeprazole (PRILOSEC) 20 MG capsule Take 20 mg by mouth daily.    . pramipexole (MIRAPEX) 1 MG tablet Take 1 mg by mouth at bedtime.     . sitaGLIPtin (JANUVIA) 100 MG tablet Take 100 mg by mouth daily.     No facility-administered medications prior to visit.     PAST MEDICAL HISTORY: Past Medical History:  Diagnosis Date  . Allergy to poison ivy   . Arthritis  knees  . 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 Atypical ductal hyperplasia of right breast 12/04/2017  . Cough 11/28/2017  . Dental bridge present    upper  . Dental crowns present   . Female cystocele   . Heartburn   . History of TIA (transient ischemic attack)   . Hyperlipidemia   . Hypertension    has just added another med., states has been on medication x 10-15 yrs.  . Hypothyroidism   . Non-insulin dependent type 2 diabetes mellitus ([place])   . Osteoarthritis    knees   . Restless leg syndrome   . Sleep apnea    uses CPAP nightly    PAST SURGICAL HISTORY: Past Surgical History:  Procedure Laterality Date  . BREAST LUMPECTOMY WITH RADIOACTIVE SEED LOCALIZATION Right 12/04/2017   Procedure: RIGHT BREAST LUMPECTOMY WITH RADIOACTIVE SEED LOCALIZATION ERAS PATHWAY;  Surgeon: Fanny Skates, MD;  Location: [facility];  Service: General;  Laterality: Right;  . CATARACT EXTRACTION W/ INTRAOCULAR LENS  IMPLANT, BILATERAL    . COLONOSCOPY  2013  . FACIAL RECONSTRUCTION SURGERY     age 74  . KNEE ARTHROSCOPY Left     FAMILY HISTORY: Family History  Problem Relation Age of Onset  . Cancer Mother        breast  . Lymphoma Mother   . Heart disease Father        blockage in carotid  . Heart disease Brother        smoker    SOCIAL HISTORY:  Social History   Socioeconomic History  . Marital status: Widowed    Spouse name: Not on file  . Number of children: 4  . Years of education: 7  . Highest education level: Not on file  Social Needs  . Financial resource strain: Not on file  . Food insecurity - worry: Not on file  . Food insecurity - inability: Not on file  . Transportation needs - medical: Not on file  . Transportation needs - non-medical: Not on file  Occupational History    Comment: na  Tobacco Use  . Smoking status: Never Smoker  . Smokeless tobacco: Never Used  Substance and Sexual Activity  . Alcohol use: No  . Drug use: No  . Sexual activity: Not on file  Other Topics Concern  . Not on file  Social History Narrative   Lives alone   Caffeine- coffee, Coke, 2 daily     PHYSICAL EXAM  GENERAL EXAM/CONSTITUTIONAL: Vitals:  Vitals:   01/22/18 1410  BP: (!) 146/73  Pulse: 75  Weight: 173 lb 3.2 oz (78.6 kg)  Height: 5\' 5"  (1.651 m)   Body mass index is 28.82 kg/m. No exam data present  Patient is in no distress; well developed, nourished and groomed; neck is supple  CARDIOVASCULAR:  Examination of carotid arteries  is normal; no carotid bruits  Regular rate and rhythm, no murmurs  Examination of peripheral vascular system by observation and palpation is normal  EYES:  Ophthalmoscopic exam of optic discs and posterior segments is normal; no papilledema or hemorrhages  MUSCULOSKELETAL:  Gait, strength, tone, movements noted in Neurologic exam below  NEUROLOGIC: MENTAL STATUS:  No flowsheet data found.  awake, alert, oriented to person, place and time  recent and remote memory intact  normal attention and concentration  language fluent, comprehension intact, naming intact,   fund of knowledge appropriate  CRANIAL NERVE:   2nd - no papilledema on fundoscopic exam  2nd,  3rd, 4th, 6th - pupils equal and reactive to light, visual fields full to confrontation, extraocular muscles intact, no nystagmus  5th - facial sensation symmetric  7th - facial strength symmetric  8th - hearing intact  9th - palate elevates symmetrically, uvula midline  11th - shoulder shrug symmetric  12th - tongue protrusion midline  MOTOR:   normal bulk and tone, full strength in the BUE, BLE  SENSORY:   normal and symmetric to light touch, temperature, vibration  COORDINATION:   finger-nose-finger, fine finger movements normal  REFLEXES:   deep tendon reflexes present and symmetric  GAIT/STATION:   narrow based gait    DIAGNOSTIC DATA (LABS, IMAGING, TESTING) - I reviewed patient records, labs, notes, testing and imaging myself where available.  Lab Results  Component Value Date   WBC 7.4 12/04/2017   HGB 11.3 (L) 12/04/2017   HCT 35.1 (L) 12/04/2017   MCV 83.2 12/04/2017   PLT 261 12/04/2017      Component Value Date/Time   NA 134 (L) 12/04/2017 0957   K 3.9 12/04/2017 0957   CL 100 (L) 12/04/2017 0957   CO2 24 12/04/2017 0957   GLUCOSE 339 (H) 12/04/2017 0957   BUN 21 (H) 12/04/2017 0957   CREATININE 0.69 12/04/2017 0957   CALCIUM 9.3 12/04/2017 0957   PROT 6.9 12/04/2017  0957   ALBUMIN 3.9 12/04/2017 0957   AST 29 12/04/2017 0957   ALT 21 12/04/2017 0957   ALKPHOS 61 12/04/2017 0957   BILITOT 1.2 12/04/2017 0957   GFRNONAA >60 12/04/2017 0957   GFRAA >60 12/04/2017 0957   No results found for: CHOL, HDL, LDLCALC, LDLDIRECT, TRIG, CHOLHDL No results found for: HGBA1C No results found for: VITAMINB12 No results found for: TSH   08/02/17 MRI brain [I reviewed images myself and agree with interpretation. -VRP] 1. No acute intracranial abnormality. 2. Mild paranasal sinus disease. 3. Otherwise unremarkable MRI of the head.  10/27/17 MRA head [I reviewed images myself and agree with interpretation. -VRP] 1. Mild stenosis of the distal vertebral artery. This does not appear to be hemodynamically significant.   Other arteries appear normal. 2. No aneurysms are noted  10/27/17 MRA neck [I reviewed images myself and agree with interpretation. -VRP] - shows minimal (15-20%) stenosis at the left carotid bulb/proximal internal carotid artery and at the right proximal internal carotid artery  10/23/17 TTE - Normal LV systolic function; mild diastolic dysfunction.    ASSESSMENT AND PLAN  72 y.o. year old female here with transient left arm and right face numbness. Likely TIA. Recommend medical mgmt.    Dx:  1. TIA (transient ischemic attack)      PLAN:  TRANSIENT LEFT ARM / RIGHT FACE NUMBNESS (possible TIA) - continue medical mgmt of risk factors for stroke prevention: aspirin 325mg , statin, BP control, diabetes control, sleep apnea treatment  Return if symptoms worsen or fail to improve, for return to PCP.    Penni Bombard, MD 2/77/8242, 3:53 PM Certified in Neurology, Neurophysiology and Neuroimaging  [facility] [address] [place], [place] [address] [phone]

## 2018-02-06 DIAGNOSIS — N8110 Cystocele, unspecified: Secondary | ICD-10-CM | POA: Diagnosis not present

## 2018-02-06 DIAGNOSIS — E782 Mixed hyperlipidemia: Secondary | ICD-10-CM | POA: Diagnosis not present

## 2018-02-06 DIAGNOSIS — I10 Essential (primary) hypertension: Secondary | ICD-10-CM | POA: Diagnosis not present

## 2018-02-06 DIAGNOSIS — E118 Type 2 diabetes mellitus with unspecified complications: Secondary | ICD-10-CM | POA: Diagnosis not present

## 2018-03-14 DIAGNOSIS — E089 Diabetes mellitus due to underlying condition without complications: Secondary | ICD-10-CM | POA: Diagnosis not present

## 2018-03-14 DIAGNOSIS — E782 Mixed hyperlipidemia: Secondary | ICD-10-CM | POA: Diagnosis not present

## 2018-03-14 DIAGNOSIS — I110 Hypertensive heart disease with heart failure: Secondary | ICD-10-CM | POA: Diagnosis not present

## 2018-03-14 DIAGNOSIS — E785 Hyperlipidemia, unspecified: Secondary | ICD-10-CM | POA: Diagnosis not present

## 2018-03-14 DIAGNOSIS — E118 Type 2 diabetes mellitus with unspecified complications: Secondary | ICD-10-CM | POA: Diagnosis not present

## 2018-04-25 DIAGNOSIS — E0840 Diabetes mellitus due to underlying condition with diabetic neuropathy, unspecified: Secondary | ICD-10-CM | POA: Diagnosis not present

## 2018-04-25 DIAGNOSIS — I10 Essential (primary) hypertension: Secondary | ICD-10-CM | POA: Diagnosis not present

## 2018-04-25 DIAGNOSIS — M1991 Primary osteoarthritis, unspecified site: Secondary | ICD-10-CM | POA: Diagnosis not present

## 2018-04-25 DIAGNOSIS — E118 Type 2 diabetes mellitus with unspecified complications: Secondary | ICD-10-CM | POA: Diagnosis not present

## 2018-04-29 IMAGING — CR DG CHEST 2V
2 series · 2 of 2 positions shown · non-contrast
Comparison: 05/19/2017

CLINICAL DATA: Acute onset left arm numbness, diaphoresis, and
nausea today.

EXAM:
CHEST  2 VIEW

[x chest ap]
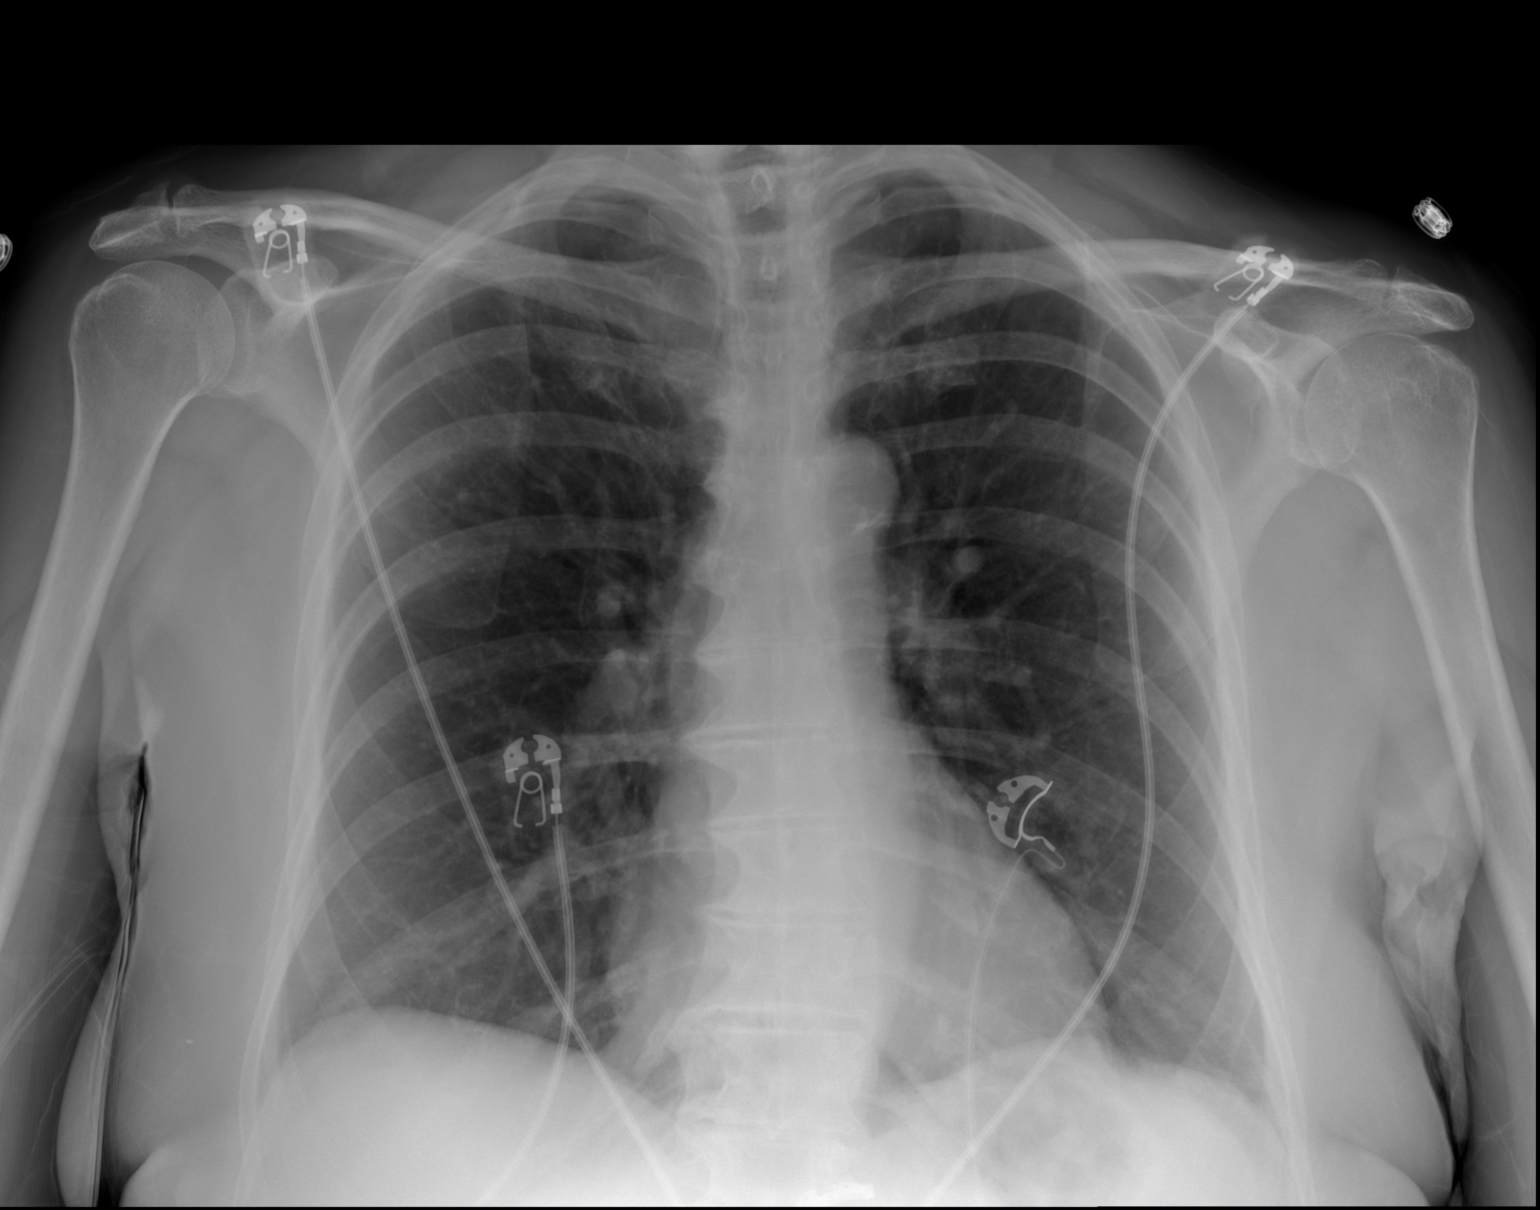

[w chest lat]
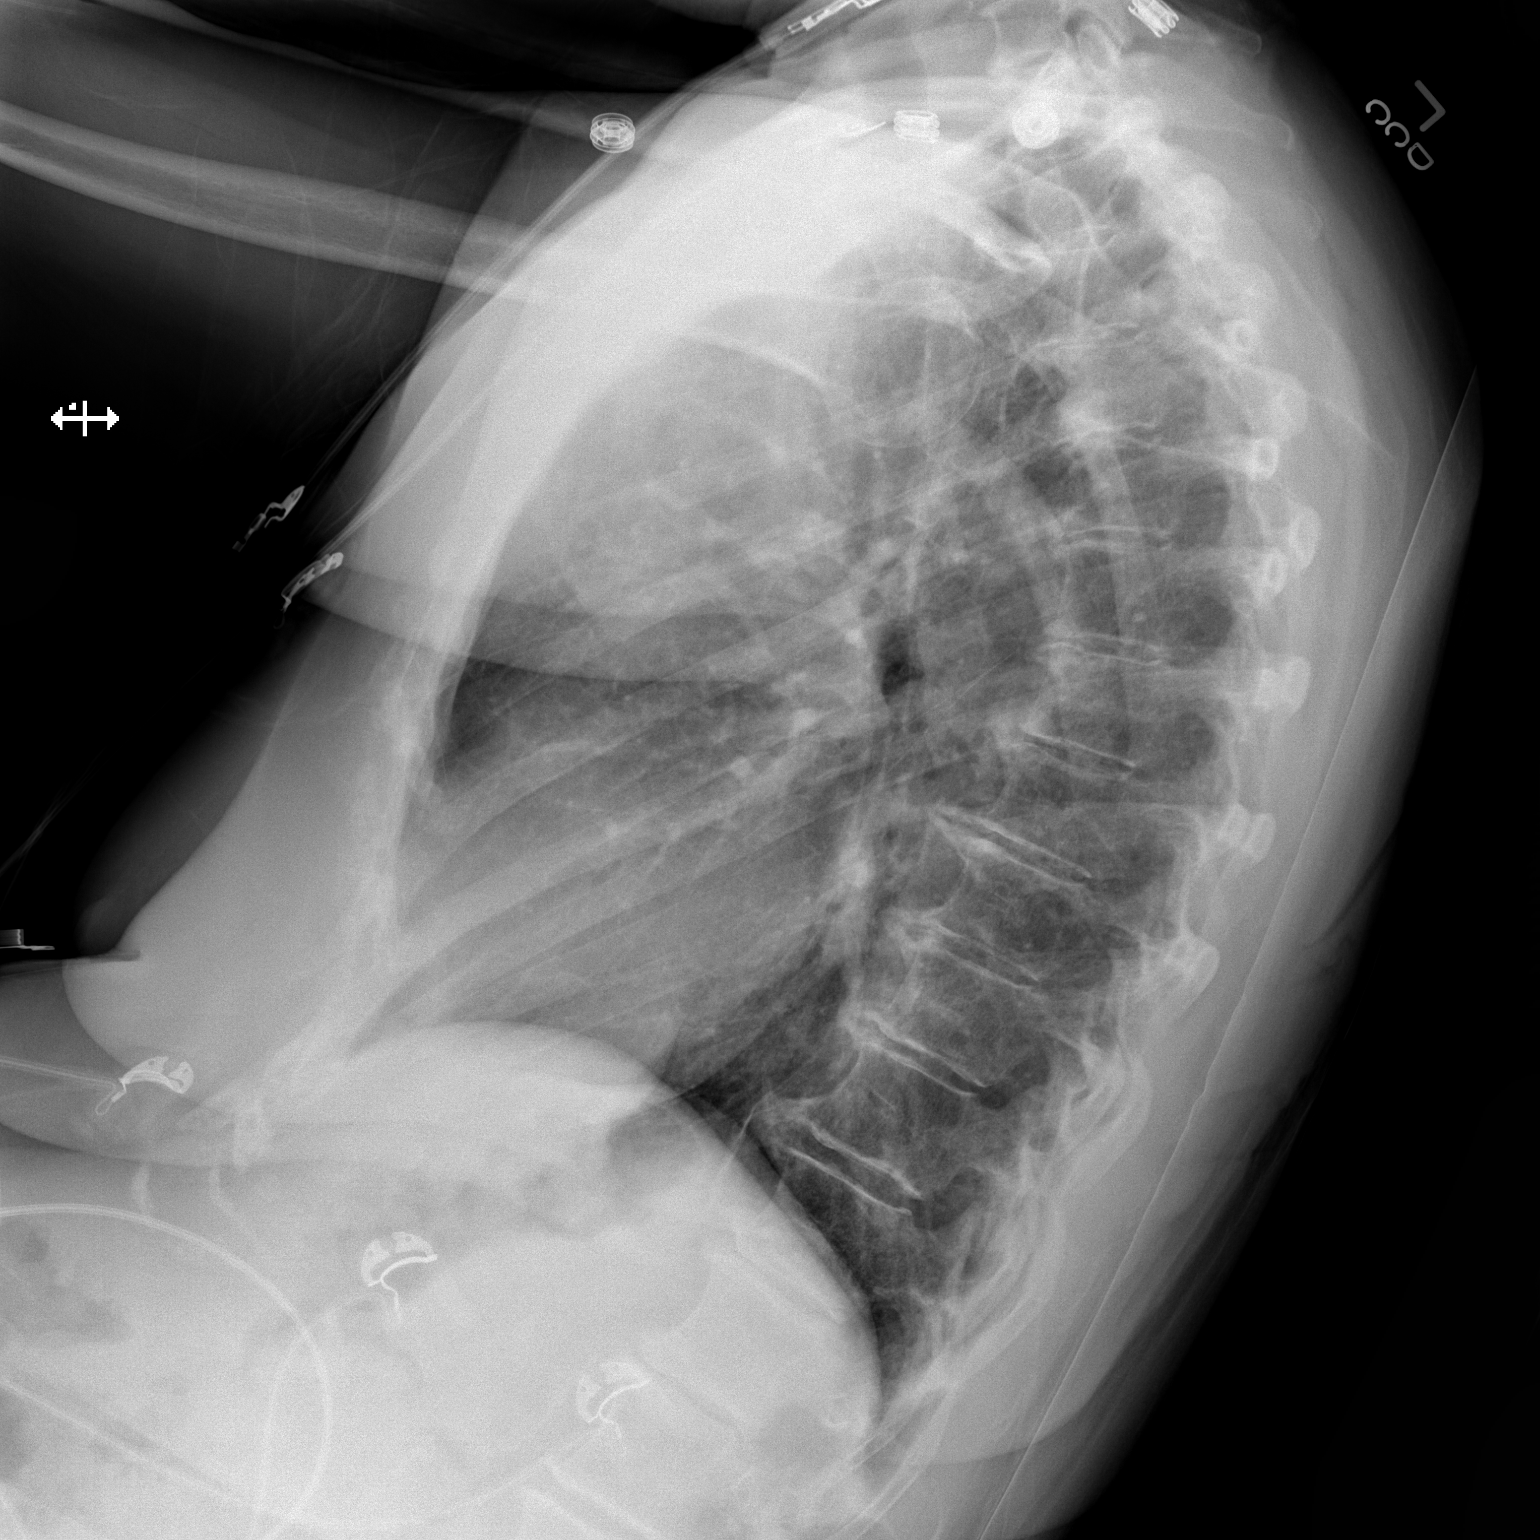

[2 of 2 positions shown; findings below may reference images not displayed]

FINDINGS: The heart size and mediastinal contours are within normal limits.
Aortic atherosclerosis. Both lungs are clear. Mild thoracic spine
degenerative disc disease noted.
IMPRESSION: No active cardiopulmonary disease.

## 2018-05-28 DIAGNOSIS — R079 Chest pain, unspecified: Secondary | ICD-10-CM | POA: Diagnosis not present

## 2018-05-28 DIAGNOSIS — E782 Mixed hyperlipidemia: Secondary | ICD-10-CM | POA: Diagnosis not present

## 2018-05-28 DIAGNOSIS — I10 Essential (primary) hypertension: Secondary | ICD-10-CM | POA: Diagnosis not present

## 2018-05-28 DIAGNOSIS — R202 Paresthesia of skin: Secondary | ICD-10-CM | POA: Diagnosis not present

## 2018-05-28 DIAGNOSIS — D649 Anemia, unspecified: Secondary | ICD-10-CM | POA: Diagnosis not present

## 2018-05-28 DIAGNOSIS — R5383 Other fatigue: Secondary | ICD-10-CM | POA: Diagnosis not present

## 2018-05-28 DIAGNOSIS — E118 Type 2 diabetes mellitus with unspecified complications: Secondary | ICD-10-CM | POA: Diagnosis not present

## 2018-05-29 DIAGNOSIS — H04123 Dry eye syndrome of bilateral lacrimal glands: Secondary | ICD-10-CM | POA: Diagnosis not present

## 2018-05-29 DIAGNOSIS — E119 Type 2 diabetes mellitus without complications: Secondary | ICD-10-CM | POA: Diagnosis not present

## 2018-05-29 DIAGNOSIS — Z961 Presence of intraocular lens: Secondary | ICD-10-CM | POA: Diagnosis not present

## 2018-05-29 DIAGNOSIS — H43812 Vitreous degeneration, left eye: Secondary | ICD-10-CM | POA: Diagnosis not present

## 2018-05-30 DIAGNOSIS — R0789 Other chest pain: Secondary | ICD-10-CM | POA: Diagnosis not present

## 2018-05-30 DIAGNOSIS — E119 Type 2 diabetes mellitus without complications: Secondary | ICD-10-CM | POA: Diagnosis not present

## 2018-05-30 DIAGNOSIS — R200 Anesthesia of skin: Secondary | ICD-10-CM | POA: Diagnosis not present

## 2018-05-30 DIAGNOSIS — Z8673 Personal history of transient ischemic attack (TIA), and cerebral infarction without residual deficits: Secondary | ICD-10-CM | POA: Diagnosis not present

## 2018-05-30 DIAGNOSIS — I10 Essential (primary) hypertension: Secondary | ICD-10-CM | POA: Diagnosis not present

## 2018-06-04 DIAGNOSIS — I10 Essential (primary) hypertension: Secondary | ICD-10-CM | POA: Diagnosis not present

## 2018-06-04 DIAGNOSIS — E785 Hyperlipidemia, unspecified: Secondary | ICD-10-CM | POA: Diagnosis not present

## 2018-06-04 DIAGNOSIS — G4730 Sleep apnea, unspecified: Secondary | ICD-10-CM | POA: Diagnosis not present

## 2018-06-04 DIAGNOSIS — R5383 Other fatigue: Secondary | ICD-10-CM | POA: Diagnosis not present

## 2018-06-04 DIAGNOSIS — E118 Type 2 diabetes mellitus with unspecified complications: Secondary | ICD-10-CM | POA: Diagnosis not present

## 2018-06-04 DIAGNOSIS — R200 Anesthesia of skin: Secondary | ICD-10-CM | POA: Diagnosis not present

## 2018-06-06 DIAGNOSIS — E7800 Pure hypercholesterolemia, unspecified: Secondary | ICD-10-CM | POA: Diagnosis not present

## 2018-06-06 DIAGNOSIS — E119 Type 2 diabetes mellitus without complications: Secondary | ICD-10-CM | POA: Diagnosis not present

## 2018-06-06 DIAGNOSIS — I10 Essential (primary) hypertension: Secondary | ICD-10-CM | POA: Diagnosis not present

## 2018-06-08 DIAGNOSIS — R0789 Other chest pain: Secondary | ICD-10-CM | POA: Diagnosis not present

## 2018-06-08 DIAGNOSIS — I10 Essential (primary) hypertension: Secondary | ICD-10-CM | POA: Diagnosis not present

## 2018-06-08 DIAGNOSIS — E119 Type 2 diabetes mellitus without complications: Secondary | ICD-10-CM | POA: Diagnosis not present

## 2018-06-08 DIAGNOSIS — R200 Anesthesia of skin: Secondary | ICD-10-CM | POA: Diagnosis not present

## 2018-06-12 DIAGNOSIS — Z1211 Encounter for screening for malignant neoplasm of colon: Secondary | ICD-10-CM | POA: Diagnosis not present

## 2018-06-12 DIAGNOSIS — Z1212 Encounter for screening for malignant neoplasm of rectum: Secondary | ICD-10-CM | POA: Diagnosis not present

## 2018-07-20 DIAGNOSIS — Z803 Family history of malignant neoplasm of breast: Secondary | ICD-10-CM | POA: Diagnosis not present

## 2018-07-20 DIAGNOSIS — E118 Type 2 diabetes mellitus with unspecified complications: Secondary | ICD-10-CM | POA: Diagnosis not present

## 2018-07-20 DIAGNOSIS — I10 Essential (primary) hypertension: Secondary | ICD-10-CM | POA: Diagnosis not present

## 2018-07-20 DIAGNOSIS — E785 Hyperlipidemia, unspecified: Secondary | ICD-10-CM | POA: Diagnosis not present

## 2018-07-24 DIAGNOSIS — E038 Other specified hypothyroidism: Secondary | ICD-10-CM | POA: Diagnosis not present

## 2018-07-24 DIAGNOSIS — I10 Essential (primary) hypertension: Secondary | ICD-10-CM | POA: Diagnosis not present

## 2018-07-24 DIAGNOSIS — Z6830 Body mass index (BMI) 30.0-30.9, adult: Secondary | ICD-10-CM | POA: Diagnosis not present

## 2018-07-24 DIAGNOSIS — R195 Other fecal abnormalities: Secondary | ICD-10-CM | POA: Diagnosis not present

## 2018-07-24 DIAGNOSIS — E782 Mixed hyperlipidemia: Secondary | ICD-10-CM | POA: Diagnosis not present

## 2018-07-25 ENCOUNTER — Other Ambulatory Visit: Payer: Self-pay

## 2018-08-29 DIAGNOSIS — R195 Other fecal abnormalities: Secondary | ICD-10-CM | POA: Diagnosis not present

## 2018-08-29 DIAGNOSIS — Z800 Family history of malignant neoplasm of digestive organs: Secondary | ICD-10-CM | POA: Diagnosis not present

## 2018-08-29 DIAGNOSIS — E119 Type 2 diabetes mellitus without complications: Secondary | ICD-10-CM | POA: Diagnosis not present

## 2018-09-19 DIAGNOSIS — Z86010 Personal history of colonic polyps: Secondary | ICD-10-CM | POA: Diagnosis not present

## 2018-09-19 DIAGNOSIS — K5730 Diverticulosis of large intestine without perforation or abscess without bleeding: Secondary | ICD-10-CM | POA: Diagnosis not present

## 2018-09-19 DIAGNOSIS — D123 Benign neoplasm of transverse colon: Secondary | ICD-10-CM | POA: Diagnosis not present

## 2018-09-19 DIAGNOSIS — Z8371 Family history of colonic polyps: Secondary | ICD-10-CM | POA: Diagnosis not present

## 2018-09-19 DIAGNOSIS — D122 Benign neoplasm of ascending colon: Secondary | ICD-10-CM | POA: Diagnosis not present

## 2018-09-21 DIAGNOSIS — D123 Benign neoplasm of transverse colon: Secondary | ICD-10-CM | POA: Diagnosis not present

## 2018-09-21 DIAGNOSIS — D122 Benign neoplasm of ascending colon: Secondary | ICD-10-CM | POA: Diagnosis not present

## 2018-09-27 DIAGNOSIS — M1712 Unilateral primary osteoarthritis, left knee: Secondary | ICD-10-CM | POA: Diagnosis not present

## 2018-09-27 DIAGNOSIS — M1711 Unilateral primary osteoarthritis, right knee: Secondary | ICD-10-CM | POA: Diagnosis not present

## 2018-10-18 DIAGNOSIS — E782 Mixed hyperlipidemia: Secondary | ICD-10-CM | POA: Diagnosis not present

## 2018-10-18 DIAGNOSIS — E118 Type 2 diabetes mellitus with unspecified complications: Secondary | ICD-10-CM | POA: Diagnosis not present

## 2018-10-18 DIAGNOSIS — G4730 Sleep apnea, unspecified: Secondary | ICD-10-CM | POA: Diagnosis not present

## 2018-10-18 DIAGNOSIS — E038 Other specified hypothyroidism: Secondary | ICD-10-CM | POA: Diagnosis not present

## 2018-10-18 DIAGNOSIS — I10 Essential (primary) hypertension: Secondary | ICD-10-CM | POA: Diagnosis not present

## 2018-10-18 DIAGNOSIS — R195 Other fecal abnormalities: Secondary | ICD-10-CM | POA: Diagnosis not present

## 2018-10-18 DIAGNOSIS — D649 Anemia, unspecified: Secondary | ICD-10-CM | POA: Diagnosis not present

## 2018-10-23 DIAGNOSIS — I10 Essential (primary) hypertension: Secondary | ICD-10-CM | POA: Diagnosis not present

## 2018-11-06 ENCOUNTER — Encounter

## 2018-11-06 ENCOUNTER — Encounter: Payer: Self-pay | Admitting: Diagnostic Neuroimaging

## 2018-11-06 ENCOUNTER — Ambulatory Visit (INDEPENDENT_AMBULATORY_CARE_PROVIDER_SITE_OTHER): Payer: Medicare Other | Admitting: Diagnostic Neuroimaging

## 2018-11-06 VITALS — BP 144/76 | HR 68 | Ht 65.0 in | Wt 179.6 lb

## 2018-11-06 DIAGNOSIS — Z9989 Dependence on other enabling machines and devices: Secondary | ICD-10-CM | POA: Diagnosis not present

## 2018-11-06 DIAGNOSIS — R202 Paresthesia of skin: Secondary | ICD-10-CM | POA: Diagnosis not present

## 2018-11-06 DIAGNOSIS — G4733 Obstructive sleep apnea (adult) (pediatric): Secondary | ICD-10-CM | POA: Diagnosis not present

## 2018-11-06 DIAGNOSIS — R200 Anesthesia of skin: Secondary | ICD-10-CM | POA: Diagnosis not present

## 2018-11-06 MED ORDER — ASPIRIN EC 81 MG PO TBEC: 81.0000 mg | DELAYED_RELEASE_TABLET | Freq: Every day | ORAL | Status: DC

## 2018-11-06 NOTE — Progress Notes (Signed)
Fax confirmation received surgical clearance [facility].  [phone]. sy

## 2018-11-06 NOTE — Progress Notes (Signed)
[facility]  PATIENT: Jill Contreras DOB: 1946/05/15  REFERRING CLINICIAN: Clayburn Pert HISTORY FROM: patient and chart review REASON FOR VISIT: follow up    HISTORICAL  CHIEF COMPLAINT:  Chief Complaint  Patient presents with  . Follow-up    9 month follow up. Alone. Rm 7. Patient mentiond that her numbness on the right side of her face comes occassionally. She stated that she thinks its stress related. Patient has a surgical clearance form.     HISTORY OF PRESENT ILLNESS:   UPDATE (11/06/18, VRP): Since last visit, continues with transient right facial numbness (minutes - hours; every 2-3 weeks; usually in car or at doctors office; similar over the last 2+ years). No left arm numbness. Also planning to have left knee replacement and needs neurology clearance. Also with history of OSA and wants to establish with local sleep clinic.   UPDATE (01/22/18, VRP): Since last visit, doing well. Tolerating meds. No alleviating or aggravating factors. No recurrent symptoms.  PRIOR HPI (10/13/17): 72 year old female with hypertension, diabetes, hypercholesteremia, TIA, here for valuation of transient left arm and right face numbness.  08/02/17 patient was at home, her grandson, playing on the floor, when all of a sudden she broke out into a cold sweat, then felt numbness in her left hand and left arm. She also noticed some right face numbness. She had some nausea. This lasted approximately 12 hours. Patient went to the emergency room for evaluation. MRI of the brain was negative for acute ischemic infarction. Blood testing was negative. Patient followed up with PCP who recommended neurology follow-up.  Patient had a different episode in 2003 where she felt slurred speech and gait difficulty. Apparently she was evaluated in the hospital for possible TIA. Around that time patient had cough and cold symptoms and was taking a lot of cough medication.  Patient also has bilateral knee  arthritis and is getting workup for possible knee replacement.  July 2018 patient had mammogram which detected an abnormality for which patient is scheduled to have lumpectomy. However the surgeon is awaiting neurology consultation today before proceeding with surgery.    REVIEW OF SYSTEMS: Full 14 system review of systems performed and negative with exception of: OSA face numbness.     ALLERGIES: Allergies  Allergen Reactions  . Mango Flavor Swelling    SWELLING OF FACE AND EYES  . Penicillins Other (See Comments)    SWELLING OF LIPS   . Lisinopril Cough    HOME MEDICATIONS: Outpatient Medications Prior to Visit  Medication Sig Dispense Refill  . aspirin EC 325 MG tablet Take 325 mg by mouth at bedtime.    Marland Kitchen atorvastatin (LIPITOR) 80 MG tablet Take 80 mg by mouth daily.    . carvedilol (COREG) 6.25 MG tablet Take 6.25 mg by mouth 2 (two) times daily with a meal.    . Dextromethorphan-Guaifenesin (CORICIDIN HBP CONGESTION/COUGH PO) Take by mouth.    . ENTRESTO 97-103 MG TK 1 T PO BID  3  . ezetimibe (ZETIA) 10 MG tablet Take 10 mg by mouth daily.    Marland Kitchen HYDROcodone-acetaminophen (NORCO) 5-325 MG tablet Take 1-2 tablets by mouth every 6 (six) hours as needed for moderate pain or severe pain. 20 tablet 0  . levothyroxine (SYNTHROID, LEVOTHROID) 100 MCG tablet Take 100 mcg by mouth daily before breakfast.    . metFORMIN (GLUCOPHAGE) 1000 MG tablet Take 500 mg by mouth 3 (three) times daily.     Marland Kitchen omeprazole (PRILOSEC) 20 MG capsule  Take 20 mg by mouth daily.    . pramipexole (MIRAPEX) 1 MG tablet Take 1 mg by mouth at bedtime.     . repaglinide (PRANDIN) 1 MG tablet Take 1 mg by mouth 3 (three) times daily before meals.    . sitaGLIPtin (JANUVIA) 100 MG tablet Take 100 mg by mouth daily.     No facility-administered medications prior to visit.     PAST MEDICAL HISTORY: Past Medical History:  Diagnosis Date  . Allergy to poison ivy   . Arthritis    knees  . Atypical ductal  hyperplasia of right breast 11/2017  . Atypical ductal hyperplasia of right breast 12/04/2017  . Cough 11/28/2017  . Dental bridge present    upper  . Dental crowns present   . Female cystocele   . Heartburn   . History of TIA (transient ischemic attack)   . Hyperlipidemia   . Hypertension    has just added another med., states has been on medication x 10-15 yrs.  . Hypothyroidism   . Non-insulin dependent type 2 diabetes mellitus ([place])   . Osteoarthritis    knees  . Restless leg syndrome   . Sleep apnea    uses CPAP nightly    PAST SURGICAL HISTORY: Past Surgical History:  Procedure Laterality Date  . BREAST LUMPECTOMY WITH RADIOACTIVE SEED LOCALIZATION Right 12/04/2017   Procedure: RIGHT BREAST LUMPECTOMY WITH RADIOACTIVE SEED LOCALIZATION ERAS PATHWAY;  Surgeon: Fanny Skates, MD;  Location: [facility];  Service: General;  Laterality: Right;  . CATARACT EXTRACTION W/ INTRAOCULAR LENS  IMPLANT, BILATERAL    . COLONOSCOPY  2013  . FACIAL RECONSTRUCTION SURGERY     age 86  . KNEE ARTHROSCOPY Left     FAMILY HISTORY: Family History  Problem Relation Age of Onset  . Cancer Mother        breast  . Lymphoma Mother   . Heart disease Father        blockage in carotid  . Heart disease Brother        smoker    SOCIAL HISTORY:  Social History   Socioeconomic History  . Marital status: Widowed    Spouse name: Not on file  . Number of children: 4  . Years of education: 7  . Highest education level: Not on file  Occupational History    Comment: na  Social Needs  . Financial resource strain: Not on file  . Food insecurity:    Worry: Not on file    Inability: Not on file  . Transportation needs:    Medical: Not on file    Non-medical: Not on file  Tobacco Use  . Smoking status: Never Smoker  . Smokeless tobacco: Never Used  Substance and Sexual Activity  . Alcohol use: No  . Drug use: No  . Sexual activity: Not on file  Lifestyle  . Physical activity:    Days  per week: Not on file    Minutes per session: Not on file  . Stress: Not on file  Relationships  . Social connections:    Talks on phone: Not on file    Gets together: Not on file    Attends religious service: Not on file    Active member of club or organization: Not on file    Attends meetings of clubs or organizations: Not on file    Relationship status: Not on file  . Intimate partner violence:    Fear of current or ex partner: Not  on file    Emotionally abused: Not on file    Physically abused: Not on file    Forced sexual activity: Not on file  Other Topics Concern  . Not on file  Social History Narrative   Lives alone   Caffeine- coffee, Coke, 2 daily     PHYSICAL EXAM  GENERAL EXAM/CONSTITUTIONAL: Vitals:  Vitals:   11/06/18 1118  BP: (!) 144/76  Pulse: 68  Weight: 179 lb 9.6 oz (81.5 kg)  Height: 5\' 5"  (1.651 m)   Body mass index is 29.89 kg/m. No exam data present  Patient is in no distress; well developed, nourished and groomed; neck is supple  CARDIOVASCULAR:  Examination of carotid arteries is normal; no carotid bruits  Regular rate and rhythm, no murmurs  Examination of peripheral vascular system by observation and palpation is normal  EYES:  Ophthalmoscopic exam of optic discs and posterior segments is normal; no papilledema or hemorrhages  MUSCULOSKELETAL:  Gait, strength, tone, movements noted in Neurologic exam below  NEUROLOGIC: MENTAL STATUS:  No flowsheet data found.  awake, alert, oriented to person, place and time  recent and remote memory intact  normal attention and concentration  language fluent, comprehension intact, naming intact,   fund of knowledge appropriate  CRANIAL NERVE:   2nd - no papilledema on fundoscopic exam  2nd, 3rd, 4th, 6th - pupils equal and reactive to light, visual fields full to confrontation, extraocular muscles intact, no nystagmus  5th - facial sensation symmetric  7th - facial strength  symmetric  8th - hearing intact  9th - palate elevates symmetrically, uvula midline  11th - shoulder shrug symmetric  12th - tongue protrusion midline  MOTOR:   normal bulk and tone, full strength in the BUE, BLE  SENSORY:   normal and symmetric to light touch, temperature, vibration  COORDINATION:   finger-nose-finger, fine finger movements normal  REFLEXES:   deep tendon reflexes present and symmetric  GAIT/STATION:   narrow based gait    DIAGNOSTIC DATA (LABS, IMAGING, TESTING) - I reviewed patient records, labs, notes, testing and imaging myself where available.  Lab Results  Component Value Date   WBC 7.4 12/04/2017   HGB 11.3 (L) 12/04/2017   HCT 35.1 (L) 12/04/2017   MCV 83.2 12/04/2017   PLT 261 12/04/2017      Component Value Date/Time   NA 134 (L) 12/04/2017 0957   K 3.9 12/04/2017 0957   CL 100 (L) 12/04/2017 0957   CO2 24 12/04/2017 0957   GLUCOSE 339 (H) 12/04/2017 0957   BUN 21 (H) 12/04/2017 0957   CREATININE 0.69 12/04/2017 0957   CALCIUM 9.3 12/04/2017 0957   PROT 6.9 12/04/2017 0957   ALBUMIN 3.9 12/04/2017 0957   AST 29 12/04/2017 0957   ALT 21 12/04/2017 0957   ALKPHOS 61 12/04/2017 0957   BILITOT 1.2 12/04/2017 0957   GFRNONAA >60 12/04/2017 0957   GFRAA >60 12/04/2017 0957   No results found for: CHOL, HDL, LDLCALC, LDLDIRECT, TRIG, CHOLHDL No results found for: HGBA1C No results found for: VITAMINB12 No results found for: TSH   08/02/17 MRI brain [I reviewed images myself and agree with interpretation. -VRP] 1. No acute intracranial abnormality. 2. Mild paranasal sinus disease. 3. Otherwise unremarkable MRI of the head.  10/27/17 MRA head [I reviewed images myself and agree with interpretation. -VRP] 1. Mild stenosis of the distal vertebral artery. This does not appear to be hemodynamically significant.   Other arteries appear normal. 2. No aneurysms are  noted  10/27/17 MRA neck [I reviewed images myself and agree with  interpretation. -VRP] - shows minimal (15-20%) stenosis at the left carotid bulb/proximal internal carotid artery and at the right proximal internal carotid artery  10/23/17 TTE - Normal LV systolic function; mild diastolic dysfunction.    ASSESSMENT AND PLAN  72 y.o. year old female here with transient right face numbness (every 2-3 weeks; since 2016; unlikely TIA).    Dx:  1. Numbness and tingling of right face   2. OSA on CPAP      PLAN:  TRANSIENT RIGHT FACE NUMBNESS (multiple events over last 2-3 years; workup negative in 2018; therefore unlikely to be TIA; likely non-specific benign paresthesias) - continue medical mgmt of risk factors for stroke prevention: statin, BP control, diabetes control, sleep apnea treatment - may reduce aspirin to 81mg  daily - no neurologic contraindication to knee surgery; may stop aspirin before surgery and restart as soon as possible afterwards  OSA on CPAP ([place] of state; ~ 2013) - refer to sleep clinic; needs to establish locally for CPAP follow up  Orders Placed This Encounter  Procedures  . Ambulatory referral to Sleep Studies   Return if symptoms worsen or fail to improve, for return to PCP.    Penni Bombard, MD 77/37/3668, 15:94 AM Certified in Neurology, Neurophysiology and Neuroimaging  [facility] [address], [place], [place] [address] [phone]

## 2018-11-06 NOTE — Patient Instructions (Signed)
TRANSIENT RIGHT FACE NUMBNESS (multiple events over last 2-3 years; workup negative in 2018; therefore unlikely to be TIA; likely non-specific benign paresthesias) - continue medical mgmt of risk factors for stroke prevention: statin, BP control, diabetes control, sleep apnea treatment - may reduce aspirin to 81mg  daily - no neurologic contraindication to knee surgery; may stop aspirin before surgery and restart as soon as possible afterwards  OSA on CPAP (last sleep study out of state; ~ 2013) - refer to sleep clinic; needs to establish locally for CPAP follow up

## 2018-11-07 ENCOUNTER — Other Ambulatory Visit: Payer: Self-pay | Admitting: Orthopedic Surgery

## 2018-11-08 ENCOUNTER — Other Ambulatory Visit: Payer: Self-pay | Admitting: Orthopedic Surgery

## 2018-11-12 NOTE — Patient Instructions (Addendum)
Jill Contreras  11/12/2018   Your procedure is scheduled on: 11-19-18    Report to [facility] Main  Entrance    Report to Admitting at 7:00 AM    Call this number if you have problems the morning of surgery [phone]    Remember: Do not eat food or drink liquids :After Midnight.    BRUSH YOUR TEETH MORNING OF SURGERY AND RINSE YOUR MOUTH OUT, NO CHEWING GUM CANDY OR MINTS.     Take these medicines the morning of surgery with A SIP OF WATER: Carvedilol (Coreg), Levothyroxine (Synthroid), Omeprazole (Prilosec), and Ezetimibe (Zetia)   DO NOT TAKE ANY DIABETIC MEDICATIONS DAY OF YOUR SURGERY                               You may not have any metal on your body including hair pins and              piercings  Do not wear jewelry, make-up, lotions, powders or perfumes, deodorant             Do not wear nail polish.  Do not shave  48 hours prior to surgery.                 Do not bring valuables to the hospital. [place].  Contacts, dentures or bridgework may not be worn into surgery.  Leave suitcase in the car. After surgery it may be brought to your room.    Special Instructions: N/A              Please read over the following fact sheets you were given: _____________________________________________________________________  How to Manage Your Diabetes Before and After Surgery  Why is it important to control my blood sugar before and after surgery? . Improving blood sugar levels before and after surgery helps healing and can limit problems. . A way of improving blood sugar control is eating a healthy diet by: o  Eating less sugar and carbohydrates o  Increasing activity/exercise o  Talking with your doctor about reaching your blood sugar goals . High blood sugars (greater than 180 mg/dL) can raise your risk of infections and slow your recovery, so you will need to focus on controlling your diabetes  during the weeks before surgery. . Make sure that the doctor who takes care of your diabetes knows about your planned surgery including the date and location.  How do I manage my blood sugar before surgery? . Check your blood sugar at least 4 times a day, starting 2 days before surgery, to make sure that the level is not too high or low. o Check your blood sugar the morning of your surgery when you wake up and every 2 hours until you get to the Short Stay unit. . If your blood sugar is less than 70 mg/dL, you will need to treat for low blood sugar: o Do not take insulin. o Treat a low blood sugar (less than 70 mg/dL) with  cup of clear juice (cranberry or apple), 4 glucose tablets, OR glucose gel. o Recheck blood sugar in 15 minutes after treatment (to make sure it is greater than 70 mg/dL). If your blood sugar is not greater than 70 mg/dL  on recheck, call [phone] for further instructions. . Report your blood sugar to the short stay nurse when you get to Short Stay.  . If you are admitted to the hospital after surgery: o Your blood sugar will be checked by the staff and you will probably be given insulin after surgery (instead of oral diabetes medicines) to make sure you have good blood sugar levels. o The goal for blood sugar control after surgery is 80-180 mg/dL.   WHAT DO I DO ABOUT MY DIABETES MEDICATION?  Marland Kitchen Do not take oral diabetes medicines (pills) the morning of surgery.  . THE BEFORE SURGERY, take your usual Metformin, Januvia, and Repaglinide (Prandin)        Reviewed and Endorsed by [facility] Patient Education Committee, August 2015           [facility] - Preparing for Surgery Before surgery, you can play an important role.  Because skin is not sterile, your skin needs to be as free of germs as possible.  You can reduce the number of germs on your skin by washing with CHG (chlorahexidine gluconate) soap before surgery.  CHG is an antiseptic cleaner which kills germs  and bonds with the skin to continue killing germs even after washing. Please DO NOT use if you have an allergy to CHG or antibacterial soaps.  If your skin becomes reddened/irritated stop using the CHG and inform your nurse when you arrive at Short Stay. Do not shave (including legs and underarms) for at least 48 hours prior to the first CHG shower.  You may shave your face/neck. Please follow these instructions carefully:  1.  Shower with CHG Soap the night before surgery and the  morning of Surgery.  2.  If you choose to wash your hair, wash your hair first as usual with your  normal  shampoo.  3.  After you shampoo, rinse your hair and body thoroughly to remove the  shampoo.                           4.  Use CHG as you would any other liquid soap.  You can apply chg directly  to the skin and wash                       Gently with a scrungie or clean washcloth.  5.  Apply the CHG Soap to your body ONLY FROM THE NECK DOWN.   Do not use on face/ open                           Wound or open sores. Avoid contact with eyes, ears mouth and genitals (private parts).                       Wash face,  Genitals (private parts) with your normal soap.             6.  Wash thoroughly, paying special attention to the area where your surgery  will be performed.  7.  Thoroughly rinse your body with warm water from the neck down.  8.  DO NOT shower/wash with your normal soap after using and rinsing off  the CHG Soap.                9.  Pat yourself dry with a clean towel.  10.  Wear clean pajamas.            11.  Place clean sheets on your bed the night of your first shower and do not  sleep with pets. Day of Surgery : Do not apply any lotions/deodorants the morning of surgery.  Please wear clean clothes to the hospital/surgery center.  FAILURE TO FOLLOW THESE INSTRUCTIONS MAY RESULT IN THE CANCELLATION OF YOUR SURGERY PATIENT SIGNATURE_________________________________  NURSE  SIGNATURE__________________________________  ________________________________________________________________________   Adam Phenix  An incentive spirometer is a tool that can help keep your lungs clear and active. This tool measures how well you are filling your lungs with each breath. Taking long deep breaths may help reverse or decrease the chance of developing breathing (pulmonary) problems (especially infection) following:  A long period of time when you are unable to move or be active. BEFORE THE PROCEDURE   If the spirometer includes an indicator to show your best effort, your nurse or respiratory therapist will set it to a desired goal.  If possible, sit up straight or lean slightly forward. Try not to slouch.  Hold the incentive spirometer in an upright position. INSTRUCTIONS FOR USE  1. Sit on the edge of your bed if possible, or sit up as far as you can in bed or on a chair. 2. Hold the incentive spirometer in an upright position. 3. Breathe out normally. 4. Place the mouthpiece in your mouth and seal your lips tightly around it. 5. Breathe in slowly and as deeply as possible, raising the piston or the ball toward the top of the column. 6. Hold your breath for 3-5 seconds or for as long as possible. Allow the piston or ball to fall to the bottom of the column. 7. Remove the mouthpiece from your mouth and breathe out normally. 8. Rest for a few seconds and repeat Steps 1 through 7 at least 10 times every 1-2 hours when you are awake. Take your time and take a few normal breaths between deep breaths. 9. The spirometer may include an indicator to show your best effort. Use the indicator as a goal to work toward during each repetition. 10. After each set of 10 deep breaths, practice coughing to be sure your lungs are clear. If you have an incision (the cut made at the time of surgery), support your incision when coughing by placing a pillow or rolled up towels firmly  against it. Once you are able to get out of bed, walk around indoors and cough well. You may stop using the incentive spirometer when instructed by your caregiver.  RISKS AND COMPLICATIONS  Take your time so you do not get dizzy or light-headed.  If you are in pain, you may need to take or ask for pain medication before doing incentive spirometry. It is harder to take a deep breath if you are having pain. AFTER USE  Rest and breathe slowly and easily.  It can be helpful to keep track of a log of your progress. Your caregiver can provide you with a simple table to help with this. If you are using the spirometer at home, follow these instructions: [place] IF:   You are having difficultly using the spirometer.  You have trouble using the spirometer as often as instructed.  Your pain medication is not giving enough relief while using the spirometer.  You develop fever of 100.5 F (38.1 C) or higher. SEEK IMMEDIATE MEDICAL CARE IF:   You cough up bloody  sputum that had not been present before.  You develop fever of 102 F (38.9 C) or greater.  You develop worsening pain at or near the incision site. MAKE SURE YOU:   Understand these instructions.  Will watch your condition.  Will get help right away if you are not doing well or get worse. Document Released: 04/24/2007 Document Revised: 03/05/2012 Document Reviewed: 06/25/2007 Exit[employer] Patient Information 2014 Exit[employer], [place].   ________________________________________________________________________  WHAT IS A BLOOD TRANSFUSION? Blood Transfusion Information  A transfusion is the replacement of blood or some of its parts. Blood is made up of multiple cells which provide different functions.  Red blood cells carry oxygen and are used for blood loss replacement.  White blood cells fight against infection.  Platelets control bleeding.  Plasma helps clot blood.  Other blood products are available for  specialized needs, such as hemophilia or other clotting disorders. BEFORE THE TRANSFUSION  Who gives blood for transfusions?   Healthy volunteers who are fully evaluated to make sure their blood is safe. This is blood bank blood. Transfusion therapy is the safest it has ever been in the practice of medicine. Before blood is taken from a donor, a complete history is taken to make sure that person has no history of diseases nor engages in risky social behavior (examples are intravenous drug use or sexual activity with multiple partners). The donor's travel history is screened to minimize risk of transmitting infections, such as malaria. The donated blood is tested for signs of infectious diseases, such as HIV and hepatitis. The blood is then tested to be sure it is compatible with you in order to minimize the chance of a transfusion reaction. If you or a relative donates blood, this is often done in anticipation of surgery and is not appropriate for emergency situations. It takes many days to process the donated blood. RISKS AND COMPLICATIONS Although transfusion therapy is very safe and saves many lives, the main dangers of transfusion include:   Getting an infectious disease.  Developing a transfusion reaction. This is an allergic reaction to something in the blood you were given. Every precaution is taken to prevent this. The decision to have a blood transfusion has been considered carefully by your caregiver before blood is given. Blood is not given unless the benefits outweigh the risks. AFTER THE TRANSFUSION  Right after receiving a blood transfusion, you will usually feel much better and more energetic. This is especially true if your red blood cells have gotten low (anemic). The transfusion raises the level of the red blood cells which carry oxygen, and this usually causes an energy increase.  The nurse administering the transfusion will monitor you carefully for complications. HOME CARE  INSTRUCTIONS  No special instructions are needed after a transfusion. You may find your energy is better. Speak with your caregiver about any limitations on activity for underlying diseases you may have. SEEK MEDICAL CARE IF:   Your condition is not improving after your transfusion.  You develop redness or irritation at the intravenous (IV) site. SEEK IMMEDIATE MEDICAL CARE IF:  Any of the following symptoms occur over the next 12 hours:  Shaking chills.  You have a temperature by mouth above 102 F (38.9 C), not controlled by medicine.  Chest, back, or muscle pain.  People around you feel you are not acting correctly or are confused.  Shortness of breath or difficulty breathing.  Dizziness and fainting.  You get a rash or develop hives.  You have a  decrease in urine output.  Your urine turns a dark color or changes to pink, red, or brown. Any of the following symptoms occur over the next 10 days:  You have a temperature by mouth above 102 F (38.9 C), not controlled by medicine.  Shortness of breath.  Weakness after normal activity.  The white part of the eye turns yellow (jaundice).  You have a decrease in the amount of urine or are urinating less often.  Your urine turns a dark color or changes to pink, red, or brown. Document Released: 12/09/2000 Document Revised: 03/05/2012 Document Reviewed: 07/28/2008 [facility] Patient Information 2014 [place], [place].  _______________________________________________________________________

## 2018-11-12 NOTE — Progress Notes (Signed)
11-06-18 (Epic) Neurologist clearance from Dr. Leta Baptist   10-23-17 (Epic) ECHO

## 2018-11-13 ENCOUNTER — Other Ambulatory Visit: Payer: Self-pay

## 2018-11-13 DIAGNOSIS — I10 Essential (primary) hypertension: Secondary | ICD-10-CM | POA: Diagnosis not present

## 2018-11-13 DIAGNOSIS — E785 Hyperlipidemia, unspecified: Secondary | ICD-10-CM | POA: Diagnosis not present

## 2018-11-13 DIAGNOSIS — Z6830 Body mass index (BMI) 30.0-30.9, adult: Secondary | ICD-10-CM | POA: Diagnosis not present

## 2018-11-13 DIAGNOSIS — E1100 Type 2 diabetes mellitus with hyperosmolarity without nonketotic hyperglycemic-hyperosmolar coma (NKHHC): Secondary | ICD-10-CM | POA: Diagnosis not present

## 2018-11-14 ENCOUNTER — Ambulatory Visit (HOSPITAL_COMMUNITY)
Admission: RE | Admit: 2018-11-14 | Discharge: 2018-11-14 | Disposition: A | Discharge status: Home / Self Care | Payer: Medicare Other | Source: Ambulatory Visit | Attending: Orthopedic Surgery | Admitting: Orthopedic Surgery

## 2018-11-14 ENCOUNTER — Other Ambulatory Visit: Payer: Self-pay | Admitting: Orthopedic Surgery

## 2018-11-14 ENCOUNTER — Encounter (HOSPITAL_COMMUNITY): Payer: Self-pay

## 2018-11-14 ENCOUNTER — Encounter (HOSPITAL_COMMUNITY)
Admission: RE | Admit: 2018-11-14 | Discharge: 2018-11-14 | Disposition: A | Discharge status: Home / Self Care | Payer: Medicare Other | Source: Ambulatory Visit | Attending: Orthopedic Surgery | Admitting: Orthopedic Surgery

## 2018-11-14 ENCOUNTER — Other Ambulatory Visit: Payer: Self-pay

## 2018-11-14 DIAGNOSIS — E039 Hypothyroidism, unspecified: Secondary | ICD-10-CM | POA: Insufficient documentation

## 2018-11-14 DIAGNOSIS — Z79890 Hormone replacement therapy (postmenopausal): Secondary | ICD-10-CM | POA: Insufficient documentation

## 2018-11-14 DIAGNOSIS — Z01811 Encounter for preprocedural respiratory examination: Secondary | ICD-10-CM | POA: Diagnosis not present

## 2018-11-14 DIAGNOSIS — I10 Essential (primary) hypertension: Secondary | ICD-10-CM | POA: Diagnosis not present

## 2018-11-14 DIAGNOSIS — M1712 Unilateral primary osteoarthritis, left knee: Secondary | ICD-10-CM | POA: Diagnosis not present

## 2018-11-14 DIAGNOSIS — Z79899 Other long term (current) drug therapy: Secondary | ICD-10-CM | POA: Diagnosis not present

## 2018-11-14 DIAGNOSIS — E119 Type 2 diabetes mellitus without complications: Secondary | ICD-10-CM | POA: Insufficient documentation

## 2018-11-14 DIAGNOSIS — E785 Hyperlipidemia, unspecified: Secondary | ICD-10-CM | POA: Insufficient documentation

## 2018-11-14 DIAGNOSIS — R9431 Abnormal electrocardiogram [ECG] [EKG]: Secondary | ICD-10-CM | POA: Diagnosis not present

## 2018-11-14 DIAGNOSIS — Z7982 Long term (current) use of aspirin: Secondary | ICD-10-CM | POA: Diagnosis not present

## 2018-11-14 DIAGNOSIS — Z01818 Encounter for other preprocedural examination: Secondary | ICD-10-CM | POA: Insufficient documentation

## 2018-11-14 DIAGNOSIS — Z7984 Long term (current) use of oral hypoglycemic drugs: Secondary | ICD-10-CM | POA: Diagnosis not present

## 2018-11-14 LAB — BASIC METABOLIC PANEL
ANION GAP: 8 (ref 5–15)
BUN: 15 mg/dL (ref 8–23)
CO2: 29 mmol/L (ref 22–32)
Calcium: 9.4 mg/dL (ref 8.9–10.3)
Chloride: 102 mmol/L (ref 98–111)
Creatinine, Ser: 0.54 mg/dL (ref 0.44–1.00)
Glucose, Bld: 134 mg/dL — ABNORMAL HIGH (ref 70–99)
POTASSIUM: 3.6 mmol/L (ref 3.5–5.1)
SODIUM: 139 mmol/L (ref 135–145)

## 2018-11-14 LAB — CBC WITH DIFFERENTIAL/PLATELET
ABS IMMATURE GRANULOCYTES: 0.02 10*3/uL (ref 0.00–0.07)
BASOS PCT: 1 %
Basophils Absolute: 0 10*3/uL (ref 0.0–0.1)
Eosinophils Absolute: 0.2 10*3/uL (ref 0.0–0.5)
Eosinophils Relative: 3 %
HCT: 37.5 % (ref 36.0–46.0)
Hemoglobin: 11.4 g/dL — ABNORMAL LOW (ref 12.0–15.0)
Immature Granulocytes: 0 %
Lymphocytes Relative: 38 %
Lymphs Abs: 2.7 10*3/uL (ref 0.7–4.0)
MCH: 25.6 pg — AB (ref 26.0–34.0)
MCHC: 30.4 g/dL (ref 30.0–36.0)
MCV: 84.3 fL (ref 80.0–100.0)
MONO ABS: 0.5 10*3/uL (ref 0.1–1.0)
Monocytes Relative: 8 %
NEUTROS PCT: 50 %
NRBC: 0 % (ref 0.0–0.2)
Neutro Abs: 3.5 10*3/uL (ref 1.7–7.7)
PLATELETS: 277 10*3/uL (ref 150–400)
RBC: 4.45 MIL/uL (ref 3.87–5.11)
RDW: 15.3 % (ref 11.5–15.5)
WBC: 7 10*3/uL (ref 4.0–10.5)

## 2018-11-14 LAB — URINALYSIS, ROUTINE W REFLEX MICROSCOPIC
BILIRUBIN URINE: NEGATIVE
Glucose, UA: NEGATIVE mg/dL
Hgb urine dipstick: NEGATIVE
KETONES UR: NEGATIVE mg/dL
NITRITE: NEGATIVE
PROTEIN: NEGATIVE mg/dL
SPECIFIC GRAVITY, URINE: 1.006 (ref 1.005–1.030)
pH: 7 (ref 5.0–8.0)

## 2018-11-14 LAB — HEMOGLOBIN A1C
Hgb A1c MFr Bld: 7 % — ABNORMAL HIGH (ref 4.8–5.6)
Mean Plasma Glucose: 154.2 mg/dL

## 2018-11-14 LAB — SURGICAL PCR SCREEN
MRSA, PCR: NEGATIVE
STAPHYLOCOCCUS AUREUS: NEGATIVE

## 2018-11-14 LAB — PROTIME-INR
INR: 0.95
Prothrombin Time: 12.6 seconds (ref 11.4–15.2)

## 2018-11-14 LAB — GLUCOSE, CAPILLARY: Glucose-Capillary: 135 mg/dL — ABNORMAL HIGH (ref 70–99)

## 2018-11-14 LAB — APTT: APTT: 28 s (ref 24–36)

## 2018-11-14 NOTE — H&P (Signed)
TOTAL KNEE ADMISSION H&P  Patient is being admitted for left total knee arthroplasty.  Subjective:  Chief Complaint:left knee pain.  HPI: Jill Contreras, 72 y.o. female, has a history of pain and functional disability in the left knee due to arthritis and has failed non-surgical conservative treatments for greater than 12 weeks to includeNSAID's and/or analgesics, corticosteriod injections, flexibility and strengthening excercises, use of assistive devices, weight reduction as appropriate and activity modification.  Onset of symptoms was gradual, starting 2 years ago with gradually worsening course since that time. The patient noted no past surgery on the left knee(s).  Patient currently rates pain in the left knee(s) at 10 out of 10 with activity. Patient has night pain, worsening of pain with activity and weight bearing, pain that interferes with activities of daily living, pain with passive range of motion, crepitus and joint swelling.  Patient has evidence of subchondral sclerosis, periarticular osteophytes and joint space narrowing by imaging studies.  There is no active infection.  Patient Active Problem List   Diagnosis Date Noted  . OSA on CPAP 11/06/2018  . Numbness and tingling of right face 11/06/2018  . Atypical ductal hyperplasia of right breast 12/04/2017   Past Medical History:  Diagnosis Date  . Allergy to poison ivy   . Arthritis    knees  . 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 Atypical ductal hyperplasia of right breast 12/04/2017  . Cough 11/28/2017  . Dental bridge present    upper  . Dental crowns present   . Female cystocele   . Heartburn   . History of TIA (transient ischemic attack)   . Hyperlipidemia   . Hypertension    has just added another med., states has been on medication x 10-15 yrs.  . Hypothyroidism   . Non-insulin dependent type 2 diabetes mellitus ([place])   . Osteoarthritis    knees  . Restless leg syndrome   . Sleep apnea    uses CPAP nightly    Past Surgical History:  Procedure Laterality Date  . BREAST LUMPECTOMY WITH RADIOACTIVE SEED LOCALIZATION Right 12/04/2017   Procedure: RIGHT BREAST LUMPECTOMY WITH RADIOACTIVE SEED LOCALIZATION ERAS PATHWAY;  Surgeon: Fanny Skates, MD;  Location: [facility];  Service: General;  Laterality: Right;  . CATARACT EXTRACTION W/ INTRAOCULAR LENS  IMPLANT, BILATERAL    . COLONOSCOPY  2013  . Colonscopy  2019  . FACIAL RECONSTRUCTION SURGERY     age 2  . KNEE ARTHROSCOPY Left     No current facility-administered medications for this encounter.    Current Outpatient Medications  Medication Sig Dispense Refill Last Dose  . aspirin EC 81 MG tablet Take 1 tablet (81 mg total) by mouth at bedtime.     Marland Kitchen atorvastatin (LIPITOR) 80 MG tablet Take 80 mg by mouth at bedtime.    Taking  . carvedilol (COREG) 6.25 MG tablet Take 6.25 mg by mouth 2 (two) times daily with a meal.   Taking  . ENTRESTO 97-103 MG Take 1 tablet by mouth 2 (two) times daily.   3 Taking  . ezetimibe (ZETIA) 10 MG tablet Take 10 mg by mouth daily.   Taking  . levothyroxine (SYNTHROID, LEVOTHROID) 100 MCG tablet Take 100 mcg by mouth daily before breakfast.   Taking  . metFORMIN (GLUCOPHAGE) 1000 MG tablet Take 500 mg by mouth 3 (three) times daily.    Taking  . Multiple Vitamins-Minerals (ALIVE WOMENS ENERGY PO) Take 1 tablet by mouth daily.     Marland Kitchen  omeprazole (PRILOSEC) 20 MG capsule Take 20 mg by mouth daily.   Taking  . pramipexole (MIRAPEX) 1 MG tablet Take 1 mg by mouth at bedtime.    Taking  . repaglinide (PRANDIN) 1 MG tablet Take 1 mg by mouth 3 (three) times daily before meals.   Taking  . sitaGLIPtin (JANUVIA) 100 MG tablet Take 100 mg by mouth daily.   Taking  . Turmeric 500 MG CAPS Take 1 capsule by mouth daily.     Marland Kitchen amLODipine (NORVASC) 2.5 MG tablet Take 2.5 mg by mouth at bedtime.      Allergies  Allergen Reactions  . Mango Flavor Swelling    SWELLING OF FACE AND EYES  . Penicillins Other (See  Comments)    SWELLING OF LIPS   . Poison Ivy Extract     Pt internalizes reaction -   . Lisinopril Cough    Social History   Tobacco Use  . Smoking status: Never Smoker  . Smokeless tobacco: Never Used  Substance Use Topics  . Alcohol use: No    Family History  Problem Relation Age of Onset  . Cancer Mother        breast  . Lymphoma Mother   . Heart disease Father        blockage in carotid  . Heart disease Brother        smoker     Review of Systems  Constitutional: Negative.   HENT: Negative.   Eyes: Negative.   Respiratory: Negative.   Cardiovascular:       Htn  Gastrointestinal: Positive for heartburn.  Genitourinary:       Poor bladder control  Musculoskeletal: Positive for joint pain and myalgias.  Skin: Negative.   Neurological: Negative.   Endo/Heme/Allergies: Bruises/bleeds easily.  Psychiatric/Behavioral: Negative.     Objective:  Physical Exam  Constitutional: She is oriented to person, place, and time. She appears well-developed and well-nourished.  HENT:  Head: Normocephalic and atraumatic.  Eyes: Pupils are equal, round, and reactive to light.  Neck: Normal range of motion. Neck supple.  Cardiovascular: Intact distal pulses.  Respiratory: Effort normal.  Musculoskeletal: She exhibits tenderness.  the patient has pain with palpation over the medial and lateral joint lines of the left knee.  She has a range from roughly 5 to 110.  No instability.  Obvious crepitance with range of motion.  Her calves are soft and nontender.  She is neurovascularly intact distally.  Neurological: She is alert and oriented to person, place, and time.  Skin: Skin is warm and dry.  Psychiatric: She has a normal mood and affect. Her behavior is normal. Judgment and thought content normal.    Vital signs in last 24 hours: Temp:  [98.3 F (36.8 C)] 98.3 F (36.8 C) (11/20 1202) Pulse Rate:  [67] 67 (11/20 1202) Resp:  [18] 18 (11/20 1202) BP: (144)/(69) 144/69  (11/20 1202) SpO2:  [100 %] 100 % (11/20 1202) Weight:  [79.4 kg] 79.4 kg (11/20 1202)  Labs:   Estimated body mass index is 29.14 kg/m as calculated from the following:   Height as of 11/14/18: 5\' 5"  (1.651 m).   Weight as of 11/14/18: 79.4 kg.   Imaging Review Plain radiographs demonstrate bilateral AP weightbearing, bilateral Rosenberg, lateral sunrise views of the left knee are taken and reviewed in office today.  Patient has end-stage bone-on-bone arthritis bilaterally in her knees.  The left knee does have collapse of the medial tibial plateau and large periarticular  osteophyte formation as well as lateral shift of the tibia beneath the femur.    Preoperative templating of the joint replacement has been completed, documented, and submitted to the Operating Room personnel in order to optimize intra-operative equipment management.   Anticipated LOS equal to or greater than 2 midnights due to - Age 55 and older with one or more of the following:  - Obesity  - Expected need for hospital services (PT, OT, Nursing) required for safe  discharge  - Anticipated need for postoperative skilled nursing care or inpatient rehab  - Active co-morbidities: Diabetes    Assessment/Plan:  End stage arthritis, left knee   The patient history, physical examination, clinical judgment of the provider and imaging studies are consistent with end stage degenerative joint disease of the left knee(s) and total knee arthroplasty is deemed medically necessary. The treatment options including medical management, injection therapy arthroscopy and arthroplasty were discussed at length. The risks and benefits of total knee arthroplasty were presented and reviewed. The risks due to aseptic loosening, infection, stiffness, patella tracking problems, thromboembolic complications and other imponderables were discussed. The patient acknowledged the explanation, agreed to proceed with the plan and consent was signed.  Patient is being admitted for inpatient treatment for surgery, pain control, PT, OT, prophylactic antibiotics, VTE prophylaxis, progressive ambulation and ADL's and discharge planning. The patient is planning to be discharged home with home health services

## 2018-11-14 NOTE — Care Plan (Signed)
Spoke with patient prior to surgery. Will discharge to home with family and HHPT. Equipment ordered.     Ladell Heads, [place]

## 2018-11-16 NOTE — Progress Notes (Signed)
Pt made aware that her surgery time has changed from 9:30 AM to 7:15 AM on 11-19-18. Pt to report to admitting at 5:30 AM, and to remain NPO after midnight. Pt verbalized understanding.

## 2018-11-18 MED ORDER — BUPIVACAINE LIPOSOME 1.3 % IJ SUSP
20.0000 mL | INTRAMUSCULAR | Status: DC
  Filled 2018-11-18: qty 20

## 2018-11-18 MED ORDER — TRANEXAMIC ACID 1000 MG/10ML IV SOLN
2000.0000 mg | INTRAVENOUS | Status: DC
  Filled 2018-11-18: qty 20

## 2018-11-19 ENCOUNTER — Ambulatory Visit (HOSPITAL_COMMUNITY)
Admission: RE | Admit: 2018-11-19 | Discharge: 2018-11-20 | Disposition: A | Discharge status: Home / Self Care | Payer: Medicare Other | Source: Ambulatory Visit | Attending: Orthopedic Surgery | Admitting: Orthopedic Surgery

## 2018-11-19 ENCOUNTER — Ambulatory Visit (HOSPITAL_COMMUNITY): Payer: Medicare Other | Admitting: Anesthesiology

## 2018-11-19 ENCOUNTER — Encounter (HOSPITAL_COMMUNITY): Payer: Self-pay

## 2018-11-19 ENCOUNTER — Encounter (HOSPITAL_COMMUNITY)
Admission: RE | Disposition: A | Discharge status: Home / Self Care | Payer: Self-pay | Source: Ambulatory Visit | Attending: Orthopedic Surgery

## 2018-11-19 ENCOUNTER — Other Ambulatory Visit: Payer: Self-pay

## 2018-11-19 DIAGNOSIS — M1712 Unilateral primary osteoarthritis, left knee: Secondary | ICD-10-CM | POA: Diagnosis not present

## 2018-11-19 DIAGNOSIS — E039 Hypothyroidism, unspecified: Secondary | ICD-10-CM | POA: Insufficient documentation

## 2018-11-19 DIAGNOSIS — Z8673 Personal history of transient ischemic attack (TIA), and cerebral infarction without residual deficits: Secondary | ICD-10-CM | POA: Diagnosis not present

## 2018-11-19 DIAGNOSIS — G2581 Restless legs syndrome: Secondary | ICD-10-CM | POA: Diagnosis not present

## 2018-11-19 DIAGNOSIS — Z7982 Long term (current) use of aspirin: Secondary | ICD-10-CM | POA: Insufficient documentation

## 2018-11-19 DIAGNOSIS — Z96652 Presence of left artificial knee joint: Secondary | ICD-10-CM

## 2018-11-19 DIAGNOSIS — E785 Hyperlipidemia, unspecified: Secondary | ICD-10-CM | POA: Diagnosis not present

## 2018-11-19 DIAGNOSIS — I10 Essential (primary) hypertension: Secondary | ICD-10-CM | POA: Diagnosis not present

## 2018-11-19 DIAGNOSIS — E119 Type 2 diabetes mellitus without complications: Secondary | ICD-10-CM | POA: Insufficient documentation

## 2018-11-19 DIAGNOSIS — Z79899 Other long term (current) drug therapy: Secondary | ICD-10-CM | POA: Diagnosis not present

## 2018-11-19 DIAGNOSIS — Z7984 Long term (current) use of oral hypoglycemic drugs: Secondary | ICD-10-CM | POA: Diagnosis not present

## 2018-11-19 DIAGNOSIS — G8918 Other acute postprocedural pain: Secondary | ICD-10-CM | POA: Diagnosis not present

## 2018-11-19 DIAGNOSIS — G4733 Obstructive sleep apnea (adult) (pediatric): Secondary | ICD-10-CM | POA: Insufficient documentation

## 2018-11-19 HISTORY — PX: TOTAL KNEE ARTHROPLASTY: SHX125

## 2018-11-19 LAB — GLUCOSE, CAPILLARY
GLUCOSE-CAPILLARY: 151 mg/dL — AB (ref 70–99)
Glucose-Capillary: 177 mg/dL — ABNORMAL HIGH (ref 70–99)
Glucose-Capillary: 237 mg/dL — ABNORMAL HIGH (ref 70–99)
Glucose-Capillary: 237 mg/dL — ABNORMAL HIGH (ref 70–99)
Glucose-Capillary: 165 mg/dL — ABNORMAL HIGH (ref 70–99)

## 2018-11-19 LAB — HEMOGLOBIN A1C
Hgb A1c MFr Bld: 6.9 % — ABNORMAL HIGH (ref 4.8–5.6)
Mean Plasma Glucose: 151.33 mg/dL

## 2018-11-19 SURGERY — ARTHROPLASTY, KNEE, TOTAL
Anesthesia: Spinal | Site: Knee | Laterality: Left

## 2018-11-19 MED ORDER — PANTOPRAZOLE SODIUM 40 MG PO TBEC
40.0000 mg | DELAYED_RELEASE_TABLET | Freq: Every day | ORAL | Status: DC
  Administered 2018-11-20: 40 mg via ORAL
  Filled 2018-11-19: qty 1

## 2018-11-19 MED ORDER — REPAGLINIDE 1 MG PO TABS
1.0000 mg | ORAL_TABLET | Freq: Three times a day (TID) | ORAL | Status: DC
  Administered 2018-11-19 – 2018-11-20 (×3): 1 mg via ORAL
  Filled 2018-11-19 (×4): qty 1

## 2018-11-19 MED ORDER — MIDAZOLAM HCL 5 MG/5ML IJ SOLN
INTRAMUSCULAR | Status: DC | PRN
  Administered 2018-11-19 (×2): 1 mg via INTRAVENOUS

## 2018-11-19 MED ORDER — HYDROMORPHONE HCL 1 MG/ML IJ SOLN: 0.5000 mg | INTRAMUSCULAR | Status: DC | PRN

## 2018-11-19 MED ORDER — LIDOCAINE 2% (20 MG/ML) 5 ML SYRINGE
INTRAMUSCULAR | Status: DC | PRN
  Administered 2018-11-19: 40 mg via INTRAVENOUS

## 2018-11-19 MED ORDER — ASPIRIN 81 MG PO CHEW
81.0000 mg | CHEWABLE_TABLET | Freq: Two times a day (BID) | ORAL | Status: DC
  Administered 2018-11-19 – 2018-11-20 (×2): 81 mg via ORAL
  Filled 2018-11-19 (×2): qty 1

## 2018-11-19 MED ORDER — OXYCODONE HCL 5 MG PO TABS
5.0000 mg | ORAL_TABLET | ORAL | Status: DC | PRN
  Administered 2018-11-19 (×2): 5 mg via ORAL
  Administered 2018-11-19 – 2018-11-20 (×5): 10 mg via ORAL
  Filled 2018-11-19: qty 2
  Filled 2018-11-19: qty 1
  Filled 2018-11-19 (×5): qty 2
  Filled 2018-11-19: qty 1

## 2018-11-19 MED ORDER — TRANEXAMIC ACID-NACL 1000-0.7 MG/100ML-% IV SOLN
1000.0000 mg | INTRAVENOUS | Status: AC
  Administered 2018-11-19: 1000 mg via INTRAVENOUS
  Filled 2018-11-19: qty 100

## 2018-11-19 MED ORDER — PHENOL 1.4 % MT LIQD: 1.0000 | OROMUCOSAL | Status: DC | PRN

## 2018-11-19 MED ORDER — MIDAZOLAM HCL 2 MG/2ML IJ SOLN
INTRAMUSCULAR | Status: AC
  Filled 2018-11-19: qty 2

## 2018-11-19 MED ORDER — SODIUM CHLORIDE (PF) 0.9 % IJ SOLN
INTRAMUSCULAR | Status: AC
  Filled 2018-11-19: qty 50

## 2018-11-19 MED ORDER — ONDANSETRON HCL 4 MG/2ML IJ SOLN: 4.0000 mg | Freq: Four times a day (QID) | INTRAMUSCULAR | Status: DC | PRN

## 2018-11-19 MED ORDER — ACETAMINOPHEN 325 MG PO TABS: 325.0000 mg | ORAL_TABLET | Freq: Four times a day (QID) | ORAL | Status: DC | PRN

## 2018-11-19 MED ORDER — PROPOFOL 10 MG/ML IV BOLUS
INTRAVENOUS | Status: AC
  Filled 2018-11-19: qty 80

## 2018-11-19 MED ORDER — EPHEDRINE SULFATE-NACL 50-0.9 MG/10ML-% IV SOSY
PREFILLED_SYRINGE | INTRAVENOUS | Status: DC | PRN
  Administered 2018-11-19 (×4): 5 mg via INTRAVENOUS
  Administered 2018-11-19 (×2): 10 mg via INTRAVENOUS

## 2018-11-19 MED ORDER — SODIUM CHLORIDE (PF) 0.9 % IJ SOLN
INTRAMUSCULAR | Status: AC
  Filled 2018-11-19: qty 20

## 2018-11-19 MED ORDER — ONDANSETRON HCL 4 MG/2ML IJ SOLN
INTRAMUSCULAR | Status: DC | PRN
  Administered 2018-11-19: 4 mg via INTRAVENOUS

## 2018-11-19 MED ORDER — PHENYLEPHRINE 40 MCG/ML (10ML) SYRINGE FOR IV PUSH (FOR BLOOD PRESSURE SUPPORT)
PREFILLED_SYRINGE | INTRAVENOUS | Status: DC | PRN
  Administered 2018-11-19 (×2): 80 ug via INTRAVENOUS
  Administered 2018-11-19: 120 ug via INTRAVENOUS

## 2018-11-19 MED ORDER — TIZANIDINE HCL 2 MG PO TABS: 2.0000 mg | ORAL_TABLET | Freq: Four times a day (QID) | ORAL | 0 refills | Status: AC | PRN

## 2018-11-19 MED ORDER — METFORMIN HCL 500 MG PO TABS
500.0000 mg | ORAL_TABLET | Freq: Three times a day (TID) | ORAL | Status: DC
  Administered 2018-11-19 – 2018-11-20 (×3): 500 mg via ORAL
  Filled 2018-11-19 (×3): qty 1

## 2018-11-19 MED ORDER — PRAMIPEXOLE DIHYDROCHLORIDE 0.25 MG PO TABS
1.0000 mg | ORAL_TABLET | Freq: Every day | ORAL | Status: DC
  Administered 2018-11-19: 1 mg via ORAL
  Filled 2018-11-19: qty 4

## 2018-11-19 MED ORDER — VANCOMYCIN HCL IN DEXTROSE 1-5 GM/200ML-% IV SOLN
1000.0000 mg | INTRAVENOUS | Status: AC
  Administered 2018-11-19: 1000 mg via INTRAVENOUS
  Filled 2018-11-19: qty 200

## 2018-11-19 MED ORDER — FENTANYL CITRATE (PF) 100 MCG/2ML IJ SOLN: 25.0000 ug | INTRAMUSCULAR | Status: DC | PRN

## 2018-11-19 MED ORDER — FENTANYL CITRATE (PF) 100 MCG/2ML IJ SOLN
INTRAMUSCULAR | Status: AC
  Filled 2018-11-19: qty 2

## 2018-11-19 MED ORDER — INSULIN ASPART 100 UNIT/ML ~~LOC~~ SOLN
0.0000 [IU] | Freq: Three times a day (TID) | SUBCUTANEOUS | Status: DC
  Administered 2018-11-19 (×2): 5 [IU] via SUBCUTANEOUS
  Administered 2018-11-20 (×2): 3 [IU] via SUBCUTANEOUS
  Filled 2018-11-19: qty 1

## 2018-11-19 MED ORDER — BISACODYL 5 MG PO TBEC: 5.0000 mg | DELAYED_RELEASE_TABLET | Freq: Every day | ORAL | Status: DC | PRN

## 2018-11-19 MED ORDER — METHOCARBAMOL 500 MG IVPB - SIMPLE MED
INTRAVENOUS | Status: AC
  Filled 2018-11-19: qty 50

## 2018-11-19 MED ORDER — ALUM & MAG HYDROXIDE-SIMETH 200-200-20 MG/5ML PO SUSP: 30.0000 mL | ORAL | Status: DC | PRN

## 2018-11-19 MED ORDER — SODIUM CHLORIDE (PF) 0.9 % IJ SOLN
INTRAMUSCULAR | Status: DC | PRN
  Administered 2018-11-19: 70 mL

## 2018-11-19 MED ORDER — ROPIVACAINE HCL 7.5 MG/ML IJ SOLN
INTRAMUSCULAR | Status: DC | PRN
  Administered 2018-11-19: 20 mL via PERINEURAL

## 2018-11-19 MED ORDER — DEXAMETHASONE SODIUM PHOSPHATE 10 MG/ML IJ SOLN
10.0000 mg | Freq: Once | INTRAMUSCULAR | Status: AC
  Administered 2018-11-20: 10 mg via INTRAVENOUS
  Filled 2018-11-19: qty 1

## 2018-11-19 MED ORDER — CARVEDILOL 6.25 MG PO TABS
6.2500 mg | ORAL_TABLET | Freq: Two times a day (BID) | ORAL | Status: DC
  Filled 2018-11-19: qty 1

## 2018-11-19 MED ORDER — ATORVASTATIN CALCIUM 40 MG PO TABS
80.0000 mg | ORAL_TABLET | Freq: Every day | ORAL | Status: DC
  Administered 2018-11-19: 80 mg via ORAL
  Filled 2018-11-19: qty 2

## 2018-11-19 MED ORDER — GABAPENTIN 300 MG PO CAPS
300.0000 mg | ORAL_CAPSULE | Freq: Three times a day (TID) | ORAL | Status: DC
  Administered 2018-11-19 – 2018-11-20 (×3): 300 mg via ORAL
  Filled 2018-11-19 (×3): qty 1

## 2018-11-19 MED ORDER — SACUBITRIL-VALSARTAN 97-103 MG PO TABS
1.0000 | ORAL_TABLET | Freq: Two times a day (BID) | ORAL | Status: DC
  Administered 2018-11-20: 1 via ORAL
  Filled 2018-11-19 (×2): qty 1

## 2018-11-19 MED ORDER — PHENYLEPHRINE 40 MCG/ML (10ML) SYRINGE FOR IV PUSH (FOR BLOOD PRESSURE SUPPORT)
PREFILLED_SYRINGE | INTRAVENOUS | Status: AC
  Filled 2018-11-19: qty 10

## 2018-11-19 MED ORDER — LINAGLIPTIN 5 MG PO TABS
5.0000 mg | ORAL_TABLET | Freq: Every day | ORAL | Status: DC
  Administered 2018-11-20: 5 mg via ORAL
  Filled 2018-11-19 (×2): qty 1

## 2018-11-19 MED ORDER — ONDANSETRON HCL 4 MG/2ML IJ SOLN
INTRAMUSCULAR | Status: AC
  Filled 2018-11-19: qty 2

## 2018-11-19 MED ORDER — TRANEXAMIC ACID 1000 MG/10ML IV SOLN
INTRAVENOUS | Status: DC | PRN
  Administered 2018-11-19: 2000 mg via TOPICAL

## 2018-11-19 MED ORDER — DIPHENHYDRAMINE HCL 12.5 MG/5ML PO ELIX: 12.5000 mg | ORAL_SOLUTION | ORAL | Status: DC | PRN

## 2018-11-19 MED ORDER — OXYCODONE-ACETAMINOPHEN 5-325 MG PO TABS: 1.0000 | ORAL_TABLET | ORAL | 0 refills | Status: AC | PRN

## 2018-11-19 MED ORDER — DOCUSATE SODIUM 100 MG PO CAPS
100.0000 mg | ORAL_CAPSULE | Freq: Two times a day (BID) | ORAL | Status: DC
  Administered 2018-11-19 – 2018-11-20 (×2): 100 mg via ORAL
  Filled 2018-11-19 (×2): qty 1

## 2018-11-19 MED ORDER — PHENYLEPHRINE HCL 10 MG/ML IJ SOLN
INTRAMUSCULAR | Status: AC
  Filled 2018-11-19: qty 1

## 2018-11-19 MED ORDER — DEXAMETHASONE SODIUM PHOSPHATE 10 MG/ML IJ SOLN
INTRAMUSCULAR | Status: AC
  Filled 2018-11-19: qty 1

## 2018-11-19 MED ORDER — BUPIVACAINE-EPINEPHRINE (PF) 0.25% -1:200000 IJ SOLN
INTRAMUSCULAR | Status: DC | PRN
  Administered 2018-11-19: 30 mL

## 2018-11-19 MED ORDER — LACTATED RINGERS IV SOLN
INTRAVENOUS | Status: DC
  Administered 2018-11-19: 1000 mL via INTRAVENOUS
  Administered 2018-11-19: 08:00:00 via INTRAVENOUS

## 2018-11-19 MED ORDER — BUPIVACAINE-EPINEPHRINE (PF) 0.25% -1:200000 IJ SOLN
INTRAMUSCULAR | Status: AC
  Filled 2018-11-19: qty 30

## 2018-11-19 MED ORDER — METOCLOPRAMIDE HCL 5 MG PO TABS: 5.0000 mg | ORAL_TABLET | Freq: Three times a day (TID) | ORAL | Status: DC | PRN

## 2018-11-19 MED ORDER — 0.9 % SODIUM CHLORIDE (POUR BTL) OPTIME
TOPICAL | Status: DC | PRN
  Administered 2018-11-19: 1000 mL

## 2018-11-19 MED ORDER — BUPIVACAINE LIPOSOME 1.3 % IJ SUSP
INTRAMUSCULAR | Status: DC | PRN
  Administered 2018-11-19: 20 mL

## 2018-11-19 MED ORDER — CHLORHEXIDINE GLUCONATE 4 % EX LIQD: 60.0000 mL | Freq: Once | CUTANEOUS | Status: DC

## 2018-11-19 MED ORDER — ONDANSETRON HCL 4 MG PO TABS: 4.0000 mg | ORAL_TABLET | Freq: Four times a day (QID) | ORAL | Status: DC | PRN

## 2018-11-19 MED ORDER — FENTANYL CITRATE (PF) 100 MCG/2ML IJ SOLN
INTRAMUSCULAR | Status: DC | PRN
  Administered 2018-11-19 (×2): 50 ug via INTRAVENOUS

## 2018-11-19 MED ORDER — SODIUM CHLORIDE 0.9 % IR SOLN
Status: DC | PRN
  Administered 2018-11-19: 1000 mL

## 2018-11-19 MED ORDER — LACTATED RINGERS IV SOLN: INTRAVENOUS | Status: DC

## 2018-11-19 MED ORDER — WATER FOR IRRIGATION, STERILE IR SOLN
Status: DC | PRN
  Administered 2018-11-19: 2000 mL

## 2018-11-19 MED ORDER — PROPOFOL 500 MG/50ML IV EMUL
INTRAVENOUS | Status: DC | PRN
  Administered 2018-11-19: 100 ug/kg/min via INTRAVENOUS

## 2018-11-19 MED ORDER — TRANEXAMIC ACID-NACL 1000-0.7 MG/100ML-% IV SOLN
1000.0000 mg | Freq: Once | INTRAVENOUS | Status: AC
  Administered 2018-11-19: 1000 mg via INTRAVENOUS
  Filled 2018-11-19: qty 100

## 2018-11-19 MED ORDER — METHOCARBAMOL 500 MG PO TABS
500.0000 mg | ORAL_TABLET | Freq: Four times a day (QID) | ORAL | Status: DC | PRN
  Administered 2018-11-19 – 2018-11-20 (×2): 500 mg via ORAL
  Filled 2018-11-19 (×2): qty 1

## 2018-11-19 MED ORDER — MENTHOL 3 MG MT LOZG: 1.0000 | LOZENGE | OROMUCOSAL | Status: DC | PRN

## 2018-11-19 MED ORDER — MEPERIDINE HCL 50 MG/ML IJ SOLN: 6.2500 mg | INTRAMUSCULAR | Status: DC | PRN

## 2018-11-19 MED ORDER — DEXAMETHASONE SODIUM PHOSPHATE 10 MG/ML IJ SOLN
INTRAMUSCULAR | Status: DC | PRN
  Administered 2018-11-19: 5 mg via INTRAVENOUS

## 2018-11-19 MED ORDER — METHOCARBAMOL 500 MG IVPB - SIMPLE MED
500.0000 mg | Freq: Four times a day (QID) | INTRAVENOUS | Status: DC | PRN
  Administered 2018-11-19: 500 mg via INTRAVENOUS
  Filled 2018-11-19: qty 50

## 2018-11-19 MED ORDER — EPHEDRINE 5 MG/ML INJ
INTRAVENOUS | Status: AC
  Filled 2018-11-19: qty 10

## 2018-11-19 MED ORDER — FLEET ENEMA 7-19 GM/118ML RE ENEM: 1.0000 | ENEMA | Freq: Once | RECTAL | Status: DC | PRN

## 2018-11-19 MED ORDER — METOCLOPRAMIDE HCL 5 MG/ML IJ SOLN: 5.0000 mg | Freq: Three times a day (TID) | INTRAMUSCULAR | Status: DC | PRN

## 2018-11-19 MED ORDER — AMLODIPINE BESYLATE 5 MG PO TABS
2.5000 mg | ORAL_TABLET | Freq: Every day | ORAL | Status: DC
  Filled 2018-11-19: qty 1

## 2018-11-19 MED ORDER — ASPIRIN EC 81 MG PO TBEC: 81.0000 mg | DELAYED_RELEASE_TABLET | Freq: Two times a day (BID) | ORAL | 0 refills | Status: AC

## 2018-11-19 MED ORDER — EZETIMIBE 10 MG PO TABS
10.0000 mg | ORAL_TABLET | Freq: Every day | ORAL | Status: DC
  Administered 2018-11-20: 10 mg via ORAL
  Filled 2018-11-19: qty 1

## 2018-11-19 MED ORDER — KCL IN DEXTROSE-NACL 20-5-0.45 MEQ/L-%-% IV SOLN
INTRAVENOUS | Status: DC
  Administered 2018-11-19 (×2): via INTRAVENOUS
  Filled 2018-11-19 (×3): qty 1000

## 2018-11-19 MED ORDER — SODIUM CHLORIDE 0.9 % IV SOLN
INTRAVENOUS | Status: DC | PRN
  Administered 2018-11-19: 30 ug/min via INTRAVENOUS

## 2018-11-19 MED ORDER — BUPIVACAINE IN DEXTROSE 0.75-8.25 % IT SOLN
INTRATHECAL | Status: DC | PRN
  Administered 2018-11-19: 1.6 mL via INTRATHECAL

## 2018-11-19 MED ORDER — METOCLOPRAMIDE HCL 5 MG/ML IJ SOLN: 10.0000 mg | Freq: Once | INTRAMUSCULAR | Status: DC | PRN

## 2018-11-19 MED ORDER — LEVOTHYROXINE SODIUM 100 MCG PO TABS
100.0000 ug | ORAL_TABLET | Freq: Every day | ORAL | Status: DC
  Administered 2018-11-20: 100 ug via ORAL
  Filled 2018-11-19: qty 1

## 2018-11-19 MED ORDER — POLYETHYLENE GLYCOL 3350 17 G PO PACK: 17.0000 g | PACK | Freq: Every day | ORAL | Status: DC | PRN

## 2018-11-19 SURGICAL SUPPLY — 52 items
ATTUNE MED DOME PAT 38 KNEE (Knees) ×2 IMPLANT
ATTUNE MED DOME PAT 38MM KNEE (Knees) ×1 IMPLANT
ATTUNE PS FEM LT SZ 5 CEM KNEE (Femur) ×3 IMPLANT
ATTUNE PSRP INSR SZ5 6 KNEE (Insert) ×2 IMPLANT
ATTUNE PSRP INSR SZ5 6MM KNEE (Insert) ×1 IMPLANT
BAG DECANTER FOR FLEXI CONT (MISCELLANEOUS) ×3 IMPLANT
BAG ZIPLOCK 12X15 (MISCELLANEOUS) ×3 IMPLANT
BASE TIBIAL ROT PLAT SZ 5 KNEE (Knees) ×1 IMPLANT
BLADE SAG 18X100X1.27 (BLADE) ×3 IMPLANT
BLADE SAW SGTL 11.0X1.19X90.0M (BLADE) ×3 IMPLANT
BNDG ELASTIC 6X10 VLCR STRL LF (GAUZE/BANDAGES/DRESSINGS) ×3 IMPLANT
BOWL SMART MIX CTS (DISPOSABLE) ×3 IMPLANT
CEMENT HV SMART SET (Cement) ×6 IMPLANT
COVER SURGICAL LIGHT HANDLE (MISCELLANEOUS) ×3 IMPLANT
COVER WAND RF STERILE (DRAPES) ×3 IMPLANT
CUFF TOURN SGL QUICK 34 (TOURNIQUET CUFF) ×2
CUFF TRNQT CYL 34X4X40X1 (TOURNIQUET CUFF) ×1 IMPLANT
DECANTER SPIKE VIAL GLASS SM (MISCELLANEOUS) ×9 IMPLANT
DRAPE U-SHAPE 47X51 STRL (DRAPES) ×3 IMPLANT
DRSG AQUACEL AG ADV 3.5X10 (GAUZE/BANDAGES/DRESSINGS) ×3 IMPLANT
DURAPREP 26ML APPLICATOR (WOUND CARE) ×3 IMPLANT
ELECT REM PT RETURN 15FT ADLT (MISCELLANEOUS) ×3 IMPLANT
GLOVE BIO SURGEON STRL SZ7.5 (GLOVE) ×3 IMPLANT
GLOVE BIO SURGEON STRL SZ8.5 (GLOVE) ×3 IMPLANT
GLOVE BIOGEL PI IND STRL 8 (GLOVE) ×1 IMPLANT
GLOVE BIOGEL PI IND STRL 9 (GLOVE) ×1 IMPLANT
GLOVE BIOGEL PI INDICATOR 8 (GLOVE) ×2
GLOVE BIOGEL PI INDICATOR 9 (GLOVE) ×2
GOWN STRL REUS W/TWL XL LVL3 (GOWN DISPOSABLE) ×6 IMPLANT
HANDPIECE INTERPULSE COAX TIP (DISPOSABLE) ×2
HOOD PEEL AWAY FLYTE STAYCOOL (MISCELLANEOUS) ×9 IMPLANT
NEEDLE HYPO 21X1.5 SAFETY (NEEDLE) ×6 IMPLANT
NS IRRIG 1000ML POUR BTL (IV SOLUTION) ×3 IMPLANT
PACK ICE MAXI GEL EZY WRAP (MISCELLANEOUS) ×3 IMPLANT
PACK TOTAL KNEE CUSTOM (KITS) ×3 IMPLANT
PIN STEINMAN FIXATION KNEE (PIN) ×3 IMPLANT
PROTECTOR NERVE ULNAR (MISCELLANEOUS) ×3 IMPLANT
SET HNDPC FAN SPRY TIP SCT (DISPOSABLE) ×1 IMPLANT
STAPLER VISISTAT 35W (STAPLE) IMPLANT
SUT VIC AB 1 CTX 36 (SUTURE) ×2
SUT VIC AB 1 CTX36XBRD ANBCTR (SUTURE) ×1 IMPLANT
SUT VIC AB 2-0 CT1 27 (SUTURE) ×2
SUT VIC AB 2-0 CT1 TAPERPNT 27 (SUTURE) ×1 IMPLANT
SUT VIC AB 3-0 CT1 27 (SUTURE) ×2
SUT VIC AB 3-0 CT1 TAPERPNT 27 (SUTURE) ×1 IMPLANT
SYR CONTROL 10ML LL (SYRINGE) ×6 IMPLANT
TIBIAL BASE ROT PLAT SZ 5 KNEE (Knees) ×3 IMPLANT
TRAY FOLEY CATH 14FRSI W/METER (CATHETERS) ×3 IMPLANT
TRAY FOLEY MTR SLVR 16FR STAT (SET/KITS/TRAYS/PACK) IMPLANT
WATER STERILE IRR 1000ML POUR (IV SOLUTION) ×6 IMPLANT
WRAP KNEE MAXI GEL POST OP (GAUZE/BANDAGES/DRESSINGS) ×3 IMPLANT
YANKAUER SUCT BULB TIP 10FT TU (MISCELLANEOUS) ×3 IMPLANT

## 2018-11-19 NOTE — Anesthesia Procedure Notes (Signed)
Spinal  Patient location during procedure: OR Start time: 11/19/2018 7:17 AM End time: 11/19/2018 7:21 AM Staffing Anesthesiologist: Montez Hageman, MD Resident/CRNA: Genelle Bal, CRNA Performed: resident/CRNA  Preanesthetic Checklist Completed: patient identified, site marked, surgical consent, pre-op evaluation, timeout performed, IV checked, risks and benefits discussed and monitors and equipment checked Spinal Block Patient position: sitting Prep: DuraPrep Patient monitoring: heart rate, cardiac monitor, continuous pulse ox and blood pressure Approach: midline Location: L3-4 Injection technique: single-shot Needle Needle type: Pencan  Needle gauge: 24 G Needle length: 10 cm Assessment Sensory level: T6 Additional Notes Pt sitting for spinal, spinal attempt x1, + clear free-flowing CSF, easy to inject, - paresthesia, level to T6, NAC.

## 2018-11-19 NOTE — Interval H&P Note (Signed)
History and Physical Interval Note:  11/19/2018 7:08 AM  Jill Contreras  has presented today for surgery, with the diagnosis of LEFT KNEE OSTEOARTHRITIS  The various methods of treatment have been discussed with the patient and family. After consideration of risks, benefits and other options for treatment, the patient has consented to  Procedure(s): TOTAL KNEE ARTHROPLASTY (Left) as a surgical intervention .  The patient's history has been reviewed, patient examined, no change in status, stable for surgery.  I have reviewed the patient's chart and labs.  Questions were answered to the patient's satisfaction.     Kerin Salen

## 2018-11-19 NOTE — Discharge Instructions (Signed)

## 2018-11-19 NOTE — Anesthesia Procedure Notes (Signed)
Anesthesia Regional Block: Adductor canal block   Pre-Anesthetic Checklist: ,, timeout performed, Correct Patient, Correct Site, Correct Laterality, Correct Procedure, Correct Position, site marked, Risks and benefits discussed,  Surgical consent,  Pre-op evaluation,  At surgeon's request and post-op pain management  Laterality: Left and Lower  Prep: Maximum Sterile Barrier Precautions used, chloraprep       Needles:  Injection technique: Single-shot  Needle Type: Echogenic Stimulator Needle     Needle Length: 10cm      Additional Needles:   Procedures:,,,, ultrasound used (permanent image in chart),,,,  Narrative:  Start time: 11/19/2018 6:55 AM End time: 11/19/2018 7:00 AM Injection made incrementally with aspirations every 5 mL.  Performed by: Personally  Anesthesiologist: Montez Hageman, MD  Additional Notes: Risks, benefits and alternative to block explained extensively.  Patient tolerated procedure well, without complications.

## 2018-11-19 NOTE — Anesthesia Preprocedure Evaluation (Signed)
Anesthesia Evaluation  Patient identified by MRN, date of birth, ID band Patient awake    Reviewed: Allergy & Precautions, NPO status , Patient's Chart, lab work & pertinent test results  Airway Mallampati: II  TM Distance: >3 FB Neck ROM: Full    Dental no notable dental hx.    Pulmonary sleep apnea and Continuous Positive Airway Pressure Ventilation ,    Pulmonary exam normal breath sounds clear to auscultation       Cardiovascular hypertension, Pt. on medications Normal cardiovascular exam Rhythm:Regular Rate:Normal     Neuro/Psych negative neurological ROS  negative psych ROS   GI/Hepatic negative GI ROS, Neg liver ROS,   Endo/Other  diabetes, Type 2  Renal/GU negative Renal ROS  negative genitourinary   Musculoskeletal negative musculoskeletal ROS (+)   Abdominal   Peds negative pediatric ROS (+)  Hematology negative hematology ROS (+)   Anesthesia Other Findings   Reproductive/Obstetrics negative OB ROS                             Anesthesia Physical Anesthesia Plan  ASA: II  Anesthesia Plan: Spinal   Post-op Pain Management:  Regional for Post-op pain   Induction:   PONV Risk Score and Plan: 2 and Ondansetron and Treatment may vary due to age or medical condition  Airway Management Planned: Simple Face Mask  Additional Equipment:   Intra-op Plan:   Post-operative Plan:   Informed Consent: I have reviewed the patients History and Physical, chart, labs and discussed the procedure including the risks, benefits and alternatives for the proposed anesthesia with the patient or authorized representative who has indicated his/her understanding and acceptance.   Dental advisory given  Plan Discussed with:   Anesthesia Plan Comments:         Anesthesia Quick Evaluation

## 2018-11-19 NOTE — Op Note (Signed)
PATIENT ID:      Jill Contreras  MRN:     [id number] DOB/AGE:    [birth year] / 72 y.o.       OPERATIVE REPORT    DATE OF PROCEDURE:  11/19/2018       PREOPERATIVE DIAGNOSIS:   LEFT KNEE OSTEOARTHRITIS      Estimated body mass index is 29.29 kg/m as calculated from the following:   Height as of this encounter: 5\' 5"  (1.651 m).   Weight as of this encounter: 79.8 kg.                                                        POSTOPERATIVE DIAGNOSIS:   LEFT KNEE OSTEOARTHRITIS                                                                      PROCEDURE:  Procedure(s): TOTAL KNEE ARTHROPLASTY Using DepuyAttune RP implants #5L Femur, #5Tibia, 6 mm Attune RP bearing, 38 Patella     SURGEON: Kerin Salen    ASSISTANT:   Kerry Hough. [employer]   (Present and scrubbed throughout the case, critical for assistance with exposure, retraction, instrumentation, and closure.)         ANESTHESIA: Spinal, 20cc Exparel, 50cc 0.25% Marcaine  EBL: 300 cc  FLUID REPLACEMENT: 1600 cc crystaloid  Tourniquet Time: None  Drains: None  Tranexamic Acid: 1gm IV, 2gm topical  Exparel: 266mg    COMPLICATIONS:  None         INDICATIONS FOR PROCEDURE: The patient has  LEFT KNEE OSTEOARTHRITIS, Var deformities, XR shows bone on bone arthritis, lateral subluxation of tibia. Patient has failed all conservative measures including anti-inflammatory medicines, narcotics, attempts at exercise and weight loss, cortisone injections and viscosupplementation.  Risks and benefits of surgery have been discussed, questions answered.   DESCRIPTION OF PROCEDURE: The patient identified by armband, received  IV antibiotics, in the holding area at [facility]. Patient taken to the operating room, appropriate anesthetic monitors were attached, and Spinal anesthesia was  induced. IV Tranexamic acid was given.Tourniquet applied high to the operative thigh. Lateral post and foot positioner applied to the table, the lower extremity was  then prepped and draped in usual sterile fashion from the toes to the tourniquet. Time-out procedure was performed. The skin and subcutaneous tissue along the incision was injected with 20 cc of a mixture of Exparel and Marcaine solution, using a 20-gauge by 1-1/2 inch needle. We began the operation, with the knee flexed 130 degrees, by making the anterior midline incision starting at handbreadth above the patella going over the patella 1 cm medial to and 4 cm distal to the tibial tubercle. Small bleeders in the skin and the subcutaneous tissue identified and cauterized. Transverse retinaculum was incised and reflected medially and a medial parapatellar arthrotomy was accomplished. the patella was everted and theprepatellar fat pad resected. The superficial medial collateral ligament was then elevated from anterior to posterior along the proximal flare of the tibia and anterior half of the menisci resected. The knee was hyperflexed exposing bone  on bone arthritis. Peripheral and notch osteophytes as well as the cruciate ligaments were then resected. We continued to work our way around posteriorly along the proximal tibia, and externally rotated the tibia subluxing it out from underneath the femur. A McHale retractor was placed through the notch and a lateral Hohmann retractor placed, and we then drilled through the proximal tibia in line with the axis of the tibia followed by an intramedullary guide rod and 2-degree posterior slope cutting guide. The tibial cutting guide, 4 degree posterior sloped, was pinned into place allowing resection of 0 mm of bone medially and 10 mm of bone laterally. Satisfied with the tibial resection, we then entered the distal femur 2 mm anterior to the PCL origin with the intramedullary guide rod and applied the distal femoral cutting guide set at 9 mm, with 5 degrees of valgus. This was pinned along the epicondylar axis. At this point, the distal femoral cut was accomplished without  difficulty. We then sized for a #5L femoral component and pinned the guide in 3 degrees of external rotation. The chamfer cutting guide was pinned into place. The anterior, posterior, and chamfer cuts were accomplished without difficulty followed by the Attune RP box cutting guide and the box cut. We also removed posterior osteophytes from the posterior femoral condyles. The posterior capsule was injected with Exparel solution. The knee was brought into full extension. We checked our extension gap and fit a 6 mm bearing. Distracting in extension with a lamina spreader,  bleeders in the posterior capsule, Posterior medial and posterior lateral right down and cauterized.  The transexamic acid-soaked sponge was then placed in the gap of the knee and extension. The knee was flexed 30. The posterior patella cut was accomplished with the 9.5 mm Attune cutting guide, sized for a 101mm dome, and the fixation pegs drilled.The knee was then once again hyperflexed exposing the proximal tibia. We sized for a # 5 tibial base plate, applied the smokestack and the conical reamer followed by the the Delta fin keel punch. We then hammered into place the Attune RP trial femoral component, drilled the lugs, inserted a  6 mm trial bearing, trial patellar button, and took the knee through range of motion from 0-130 degrees. Medial and lateral ligamentous stability was checked. No thumb pressure was required for patellar Tracking. The tourniquet was not used. All trial components were removed, mating surfaces irrigated with pulse lavage, and dried with suction and sponges. 10 cc of the Exparel solution was applied to the cancellus bone of the patella distal femur and proximal tibia.  After waiting 30 seconds, the bony surfaces were again, dried with sponges. A double batch of DePuy HV cement was mixed and applied to all bony metallic mating surfaces except for the posterior condyles of the femur itself. In order, we hammered into place  the tibial tray and removed excess cement, the femoral component and removed excess cement. The final Attune RP bearing was inserted, and the knee brought to full extension with compression. The patellar button was clamped into place, and excess cement removed. The knee was held at 30 flexion with compression, while the cement cured. The wound was irrigated out with normal saline solution pulse lavage. The rest of the Exparel was injected into the parapatellar arthrotomy, subcutaneous tissues, and periosteal tissues. The parapatellar arthrotomy was closed with running #1 Vicryl suture. The subcutaneous tissue with 0 and 2-0 undyed Vicryl suture, and the skin with running 3-0 SQ vicryl. An Aquacil and  Ace wrap were applied. The patient was taken to recovery room without difficulty.   Kerin Salen 11/19/2018, 8:52 AM

## 2018-11-19 NOTE — Anesthesia Postprocedure Evaluation (Signed)
Anesthesia Post Note  Patient: Jill Contreras  Procedure(s) Performed: TOTAL KNEE ARTHROPLASTY (Left Knee)     Patient location during evaluation: PACU Anesthesia Type: Spinal Level of consciousness: awake and alert Pain management: pain level controlled Vital Signs Assessment: post-procedure vital signs reviewed and stable Respiratory status: spontaneous breathing, nonlabored ventilation, respiratory function stable and patient connected to nasal cannula oxygen Cardiovascular status: blood pressure returned to baseline and stable Postop Assessment: no apparent nausea or vomiting Anesthetic complications: no    Last Vitals:  Vitals:   11/19/18 1014 11/19/18 1112  BP: 110/64 (!) 106/44  Pulse: 63 65  Resp: 14 16  Temp: 36.4 C (!) 36.3 C  SpO2: 100% 100%    Last Pain:  Vitals:   11/19/18 1112  TempSrc: Oral  PainSc:                  Jill Contreras

## 2018-11-19 NOTE — Evaluation (Signed)
Physical Therapy Evaluation Patient Details Name: Jill Contreras MRN: [id number] DOB: [birth year] Today's Date: 11/19/2018   History of Present Illness  s/p L TKA  Clinical Impression  Pt is s/p TKA resulting in the deficits listed below (see PT Problem List).  Pt will benefit from skilled PT to increase their independence and safety with mobility to allow discharge to the venue listed below.      Follow Up Recommendations Follow surgeon's recommendation for DC plan and follow-up therapies    Equipment Recommendations  Rolling walker with 5" wheels    Recommendations for Other Services       Precautions / Restrictions Precautions Precautions: Knee;Fall Restrictions Weight Bearing Restrictions: No Other Position/Activity Restrictions: WBAT      Mobility  Bed Mobility Overal bed mobility: Needs Assistance Bed Mobility: Supine to Sit     Supine to sit: Min assist     General bed mobility comments: assist with LLE  Transfers Overall transfer level: Needs assistance Equipment used: Rolling walker (2 wheeled) Transfers: Sit to/from Stand Sit to Stand: Min assist         General transfer comment: cues for hand placement and LLE position  Ambulation/Gait Ambulation/Gait assistance: Min assist Gait Distance (Feet): 45 Feet Assistive device: Rolling walker (2 wheeled) Gait Pattern/deviations: Step-to pattern;Decreased weight shift to left     General Gait Details: cues for sequence and RW position  Stairs            Wheelchair Mobility    Modified Rankin (Stroke Patients Only)       Balance                                             Pertinent Vitals/Pain Pain Assessment: 0-10 Pain Score: 2  Pain Location: left knee Pain Descriptors / Indicators: Discomfort;Sore Pain Intervention(s): Limited activity within patient's tolerance;Monitored during session;Repositioned;Premedicated before session    [place]  expects to be discharged to:: Private residence Living Arrangements: Alone   Type of Home: House Home Access: Stairs to enter Entrance Stairs-Rails: Right Entrance Stairs-Number of Steps: 4 Home Layout: One level Home Equipment: [place] - 4 wheels;Cane - single point      Prior Function Level of Independence: Independent;Independent with assistive device(s)         Comments: amb with rollator     Hand Dominance        Extremity/Trunk Assessment   Upper Extremity Assessment Upper Extremity Assessment: Overall WFL for tasks assessed    Lower Extremity Assessment Lower Extremity Assessment: LLE deficits/detail LLE Deficits / Details: ankle WFL; knee extension and hip fleixon 2+/5       Communication   Communication: No difficulties  Cognition Arousal/Alertness: Awake/alert Behavior During Therapy: WFL for tasks assessed/performed Overall Cognitive Status: Within Functional Limits for tasks assessed                                        General Comments      Exercises Total Joint Exercises Ankle Circles/Pumps: AROM;Both;10 reps Quad Sets: Both;AROM;10 reps   Assessment/Plan    PT Assessment Patient needs continued PT services  PT Problem List Decreased strength;Decreased mobility;Decreased activity tolerance;Decreased knowledge of use of DME;Decreased range of motion;Pain;Decreased knowledge of precautions       PT Treatment  Interventions DME instruction;Therapeutic activities;Gait training;Therapeutic exercise;Functional mobility training;Patient/family education;Stair training    PT Goals (Current goals can be found in the Care Plan section)  Acute Rehab PT Goals PT Goal Formulation: With patient Time For Goal Achievement: 11/26/18 Potential to Achieve Goals: Good    Frequency 7X/week   Barriers to discharge        Co-evaluation               AM-PAC PT "6 Clicks" Mobility  Outcome Measure Help needed turning from your  back to your side while in a flat bed without using bedrails?: A Little Help needed moving from lying on your back to sitting on the side of a flat bed without using bedrails?: A Little Help needed moving to and from a bed to a chair (including a wheelchair)?: A Little Help needed standing up from a chair using your arms (e.g., wheelchair or bedside chair)?: A Little Help needed to walk in hospital room?: A Little Help needed climbing 3-5 steps with a railing? : A Little 6 Click Score: 18    End of Session Equipment Utilized During Treatment: Gait belt Activity Tolerance: Patient tolerated treatment well Patient left: with call bell/phone within reach;in chair   PT Visit Diagnosis: Difficulty in walking, not elsewhere classified (R26.2)    Time: 0722-5750 PT Time Calculation (min) (ACUTE ONLY): 28 min   Charges:   PT Evaluation $PT Eval Low Complexity: 1 Low PT Treatments $Gait Training: 8-22 mins        Kenyon Ana, PT  Pager: [phone] Acute Rehab Dept [facility]): [phone]   11/19/2018   [facility] 11/19/2018, 5:21 PM

## 2018-11-19 NOTE — Care Plan (Signed)
Ortho Bundle Case Management Note  Patient Details  Name: Jill Contreras MRN: [id number] Date of Birth: 1946/06/19      Spoke with patient prior to surgery. Will discharge to home with family and HHPT. Equipment ordered               DME Arranged:  Bedside commode, Walker rolling DME Agency:  [employer]  HH Arranged:  PT [place] Agency:  [facility]  Additional Comments: Please contact me with any questions of if this plan should need to change.  Ladell Heads,  [place] [facility]  [phone] 11/19/2018, 11:06 AM

## 2018-11-19 NOTE — Transfer of Care (Signed)
Immediate Anesthesia Transfer of Care Note  Patient: Jill Contreras  Procedure(s) Performed: TOTAL KNEE ARTHROPLASTY (Left Knee)  Patient Location: PACU  Anesthesia Type:Regional and Spinal  Level of Consciousness: sedated  Airway & Oxygen Therapy: Patient Spontanous Breathing and Patient connected to face mask oxygen  Post-op Assessment: Report given to RN and Post -op Vital signs reviewed and stable  Post vital signs: Reviewed and stable  Last Vitals:  Vitals Value Taken Time  BP    Temp    Pulse    Resp    SpO2      Last Pain:  Vitals:   11/19/18 0634  TempSrc:   PainSc: 0-No pain      Patients Stated Pain Goal: 4 (11/46/43 1427)  Complications: No apparent anesthesia complications

## 2018-11-20 DIAGNOSIS — E785 Hyperlipidemia, unspecified: Secondary | ICD-10-CM | POA: Diagnosis not present

## 2018-11-20 DIAGNOSIS — M1712 Unilateral primary osteoarthritis, left knee: Secondary | ICD-10-CM | POA: Diagnosis not present

## 2018-11-20 DIAGNOSIS — I10 Essential (primary) hypertension: Secondary | ICD-10-CM | POA: Diagnosis not present

## 2018-11-20 DIAGNOSIS — E119 Type 2 diabetes mellitus without complications: Secondary | ICD-10-CM | POA: Diagnosis not present

## 2018-11-20 DIAGNOSIS — E039 Hypothyroidism, unspecified: Secondary | ICD-10-CM | POA: Diagnosis not present

## 2018-11-20 DIAGNOSIS — G2581 Restless legs syndrome: Secondary | ICD-10-CM | POA: Diagnosis not present

## 2018-11-20 LAB — CBC
HEMATOCRIT: 27.8 % — AB (ref 36.0–46.0)
HEMOGLOBIN: 8.7 g/dL — AB (ref 12.0–15.0)
MCH: 26.5 pg (ref 26.0–34.0)
MCHC: 31.3 g/dL (ref 30.0–36.0)
MCV: 84.8 fL (ref 80.0–100.0)
NRBC: 0 % (ref 0.0–0.2)
Platelets: 226 10*3/uL (ref 150–400)
RBC: 3.28 MIL/uL — ABNORMAL LOW (ref 3.87–5.11)
RDW: 15.1 % (ref 11.5–15.5)
WBC: 12.3 10*3/uL — ABNORMAL HIGH (ref 4.0–10.5)

## 2018-11-20 LAB — BASIC METABOLIC PANEL
ANION GAP: 7 (ref 5–15)
BUN: 14 mg/dL (ref 8–23)
CHLORIDE: 103 mmol/L (ref 98–111)
CO2: 26 mmol/L (ref 22–32)
Calcium: 8.4 mg/dL — ABNORMAL LOW (ref 8.9–10.3)
Creatinine, Ser: 0.57 mg/dL (ref 0.44–1.00)
GFR calc non Af Amer: >60 mL/min (ref >60)
GLUCOSE: 188 mg/dL — AB (ref 70–99)
POTASSIUM: 3.9 mmol/L (ref 3.5–5.1)
Sodium: 136 mmol/L (ref 135–145)

## 2018-11-20 LAB — GLUCOSE, CAPILLARY
GLUCOSE-CAPILLARY: 152 mg/dL — AB (ref 70–99)
GLUCOSE-CAPILLARY: 164 mg/dL — AB (ref 70–99)

## 2018-11-20 NOTE — Discharge Summary (Signed)
Patient ID: Jill Contreras MRN: [id number] DOB/AGE: [birth year] 72 y.o.  Admit date: 11/19/2018 Discharge date: 11/20/2018  Admission Diagnoses:  Principal Problem:   Degenerative arthritis of left knee Active Problems:   S/P total knee arthroplasty, left   Discharge Diagnoses:  Same  Past Medical History:  Diagnosis Date  . Allergy to poison ivy   . Arthritis    knees  . 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 Atypical ductal hyperplasia of right breast 12/04/2017  . Cough 11/28/2017  . Dental bridge present    upper  . Dental crowns present   . Female cystocele   . Heartburn   . History of TIA (transient ischemic attack)   . Hyperlipidemia   . Hypertension    has just added another med., states has been on medication x 10-15 yrs.  . Hypothyroidism   . Non-insulin dependent type 2 diabetes mellitus ([place])   . Osteoarthritis    knees  . Restless leg syndrome   . Sleep apnea    uses CPAP nightly    Surgeries: Procedure(s): TOTAL KNEE ARTHROPLASTY on 11/19/2018   Consultants:   Discharged Condition: Improved  Hospital Course: Jill Contreras is an 72 y.o. female who was admitted 11/19/2018 for operative treatment ofDegenerative arthritis of left knee. Patient has severe unremitting pain that affects sleep, daily activities, and work/hobbies. After pre-op clearance the patient was taken to the operating room on 11/19/2018 and underwent  Procedure(s): TOTAL KNEE ARTHROPLASTY.    Patient was given perioperative antibiotics:  Anti-infectives (From admission, onward)   Start     Dose/Rate Route Frequency Ordered Stop   11/19/18 0600  vancomycin (VANCOCIN) IVPB 1000 mg/200 mL premix     1,000 mg 200 mL/hr over 60 Minutes Intravenous On call to O.R. 11/19/18 0545 11/19/18 0750       Patient was given sequential compression devices, early ambulation, and chemoprophylaxis to prevent DVT.  Patient benefited maximally from hospital stay and there were  no complications.    Recent vital signs:  Patient Vitals for the past 24 hrs:  BP Temp Temp src Pulse Resp SpO2  11/20/18 0802 (!) 111/51 - - 75 - -  11/20/18 0531 (!) 101/49 (!) 97.5 F (36.4 C) - 66 - 100 %  11/20/18 0238 116/60 97.8 F (36.6 C) Oral 76 16 100 %  11/19/18 2252 - - - 65 16 100 %  11/19/18 2040 (!) 110/55 97.6 F (36.4 C) Oral 70 18 100 %  11/19/18 1731 (!) 105/50 97.7 F (36.5 C) Oral 68 16 100 %  11/19/18 1316 115/61 (!) 97.5 F (36.4 C) - 61 18 100 %  11/19/18 1220 (!) 110/49 (!) 97.2 F (36.2 C) Oral 61 18 100 %  11/19/18 1112 (!) 106/44 (!) 97.4 F (36.3 C) Oral 65 16 100 %  11/19/18 1014 110/64 97.6 F (36.4 C) Oral 63 14 100 %  11/19/18 1000 106/60 (!) 97.5 F (36.4 C) - 63 11 99 %  11/19/18 0945 (!) 105/53 - - 64 14 96 %  11/19/18 0930 (!) 99/54 - - 68 16 98 %  11/19/18 0915 (!) 103/57 - - 67 16 100 %  11/19/18 0902 (!) 98/48 97.6 F (36.4 C) - 66 16 100 %     Recent laboratory studies:  Recent Labs    11/20/18 0453  WBC 12.3*  HGB 8.7*  HCT 27.8*  PLT 226  NA 136  K 3.9  CL 103  CO2 26  BUN 14  CREATININE 0.57  GLUCOSE 188*  CALCIUM 8.4*     Discharge Medications:   Allergies as of 11/20/2018      Reactions   Mango Flavor Swelling   SWELLING OF FACE AND EYES   Penicillins Other (See Comments)   SWELLING OF LIPS   Poison Ivy Extract    Pt internalizes reaction -    Lisinopril Cough      Medication List    TAKE these medications   ALIVE WOMENS ENERGY PO Take 1 tablet by mouth daily.   amLODipine 2.5 MG tablet Commonly known as:  NORVASC Take 2.5 mg by mouth at bedtime.   aspirin EC 81 MG tablet Take 1 tablet (81 mg total) by mouth 2 (two) times daily. What changed:  when to take this   atorvastatin 80 MG tablet Commonly known as:  LIPITOR Take 80 mg by mouth at bedtime.   carvedilol 6.25 MG tablet Commonly known as:  COREG Take 6.25 mg by mouth 2 (two) times daily with a meal.   ENTRESTO 97-103 MG Generic  drug:  sacubitril-valsartan Take 1 tablet by mouth 2 (two) times daily.   ezetimibe 10 MG tablet Commonly known as:  ZETIA Take 10 mg by mouth daily.   levothyroxine 100 MCG tablet Commonly known as:  SYNTHROID, LEVOTHROID Take 100 mcg by mouth daily before breakfast.   metFORMIN 1000 MG tablet Commonly known as:  GLUCOPHAGE Take 500 mg by mouth 3 (three) times daily.   omeprazole 20 MG capsule Commonly known as:  PRILOSEC Take 20 mg by mouth daily.   oxyCODONE-acetaminophen 5-325 MG tablet Commonly known as:  PERCOCET/ROXICET Take 1 tablet by mouth every 4 (four) hours as needed for severe pain.   pramipexole 1 MG tablet Commonly known as:  MIRAPEX Take 1 mg by mouth at bedtime.   repaglinide 1 MG tablet Commonly known as:  PRANDIN Take 1 mg by mouth 3 (three) times daily before meals.   sitaGLIPtin 100 MG tablet Commonly known as:  JANUVIA Take 100 mg by mouth daily.   tiZANidine 2 MG tablet Commonly known as:  ZANAFLEX Take 1 tablet (2 mg total) by mouth every 6 (six) hours as needed.   Turmeric 500 MG Caps Take 1 capsule by mouth daily.            Durable Medical Equipment  (From admission, onward)         Start     Ordered   11/19/18 1010  DME Walker rolling  Once    Question:  Patient needs a walker to treat with the following condition  Answer:  Status post total left knee replacement   11/19/18 1009   11/19/18 1010  DME 3 n 1  Once     11/19/18 1009           Discharge Care Instructions  (From admission, onward)         Start     Ordered   11/20/18 0000  Weight bearing as tolerated     11/20/18 0825          Diagnostic Studies: Dg Chest 2 View  Result Date: 11/14/2018 CLINICAL DATA:  Preop for knee surgery. EXAM: CHEST - 2 VIEW COMPARISON:  Radiographs of August 02, 2017. FINDINGS: The heart size and mediastinal contours are within normal limits. Both lungs are clear. The visualized skeletal structures are unremarkable. IMPRESSION:  No active cardiopulmonary disease. Electronically Signed   By: Marijo Conception, M.D.  On: 11/14/2018 16:25    Disposition: Discharge disposition: 01-Home or Self Care       Discharge Instructions    Call MD / Call 911   Complete by:  As directed    If you experience chest pain or shortness of breath, CALL 911 and be transported to the hospital emergency room.  If you develope a fever above 101 F, pus (white drainage) or increased drainage or redness at the wound, or calf pain, call your surgeon's office.   Constipation Prevention   Complete by:  As directed    Drink plenty of fluids.  Prune juice may be helpful.  You may use a stool softener, such as Colace (over the counter) 100 mg twice a day.  Use MiraLax (over the counter) for constipation as needed.   Diet - low sodium heart healthy   Complete by:  As directed    Driving restrictions   Complete by:  As directed    No driving for 2 weeks   Increase activity slowly as tolerated   Complete by:  As directed    Patient may shower   Complete by:  As directed    You may shower without a dressing once there is no drainage.  Do not wash over the wound.  If drainage remains, cover wound with plastic wrap and then shower.   Weight bearing as tolerated   Complete by:  As directed       Follow-up Information    Frederik Pear, MD. Go on 12/04/2018.   Specialty:  Orthopedic Surgery Why:  You appointment has been made for 10:30 Contact information: [place] [address] [phone]        [facility], [place]. Go on 12/04/2018.   Why:  You will start Outpatient Physical Therapy after your visit with MD. The appointment time is 1200. Please go directly over to complete your paperwork.  Contact information: Physical Therapy [place] [address] [phone]        [facility] Follow up.   Specialty:  [place] Why:  you will be seen by HHPT for 5 visits prior  to starting [place] information: [place] [address] [phone]            Signed: Joanell Rising 11/20/2018, 8:26 AM

## 2018-11-20 NOTE — Progress Notes (Signed)
Physical Therapy Treatment Patient Details Name: Jill Contreras MRN: [id number] DOB: 02-18-1946 Today's Date: 11/20/2018    History of Present Illness s/p L TKA    PT Comments    Pt is progressing well; will see for a second session for stair training and should be able to d/c later  pm   Follow Up Recommendations  Follow surgeon's recommendation for DC plan and follow-up therapies     Equipment Recommendations  Rolling walker with 5" wheels    Recommendations for Other Services       Precautions / Restrictions Precautions Precautions: Knee;Fall Restrictions Weight Bearing Restrictions: No Other Position/Activity Restrictions: WBAT    Mobility  Bed Mobility   Bed Mobility: Supine to Sit     Supine to sit: Min guard     General bed mobility comments: min/guard LLE, incr time  Transfers Overall transfer level: Needs assistance Equipment used: Rolling walker (2 wheeled) Transfers: Sit to/from Stand Sit to Stand: Min guard         General transfer comment: cues for hand placement and LLE position  Ambulation/Gait Ambulation/Gait assistance: Min guard Gait Distance (Feet): 60 Feet Assistive device: Rolling walker (2 wheeled) Gait Pattern/deviations: Step-to pattern;Decreased weight shift to left     General Gait Details: cues for sequence and RW position   Stairs             Wheelchair Mobility    Modified Rankin (Stroke Patients Only)       Balance Overall balance assessment: Needs assistance         Standing balance support: No upper extremity supported;During functional activity Standing balance-Leahy Scale: Fair                              Cognition Arousal/Alertness: Awake/alert Behavior During Therapy: WFL for tasks assessed/performed Overall Cognitive Status: Within Functional Limits for tasks assessed                                        Exercises Total Joint Exercises Ankle  Circles/Pumps: AROM;Both;10 reps Quad Sets: Both;AROM;10 reps Heel Slides: AROM;AAROM;10 reps;Both Hip ABduction/ADduction: AROM;Both Straight Leg Raises: AROM;AAROM;Both;10 reps Goniometric ROM: grossly 5* to 75* L knee flexion    General Comments        Pertinent Vitals/Pain Pain Assessment: 0-10 Pain Score: 4  Pain Location: left knee Pain Descriptors / Indicators: Discomfort;Sore Pain Intervention(s): Limited activity within patient's tolerance;Monitored during session;Premedicated before session    Home Living                      Prior Function            PT Goals (current goals can now be found in the care plan section) Acute Rehab PT Goals PT Goal Formulation: With patient Time For Goal Achievement: 11/26/18 Potential to Achieve Goals: Good Progress towards PT goals: Progressing toward goals    Frequency    7X/week      PT Plan Current plan remains appropriate    Co-evaluation              AM-PAC PT "6 Clicks" Mobility   Outcome Measure  Help needed turning from your back to your side while in a flat bed without using bedrails?: A Little Help needed moving from lying on your back to sitting on the side  of a flat bed without using bedrails?: A Little Help needed moving to and from a bed to a chair (including a wheelchair)?: A Little Help needed standing up from a chair using your arms (e.g., wheelchair or bedside chair)?: A Little Help needed to walk in hospital room?: A Little Help needed climbing 3-5 steps with a railing? : A Little 6 Click Score: 18    End of Session Equipment Utilized During Treatment: Gait belt Activity Tolerance: Patient tolerated treatment well Patient left: in chair;with call bell/phone within reach;with family/visitor present;with chair alarm set   PT Visit Diagnosis: Difficulty in walking, not elsewhere classified (R26.2)     Time: 1025-1100 PT Time Calculation (min) (ACUTE ONLY): 35 min  Charges:   $Gait Training: 8-22 mins $Therapeutic Exercise: 8-22 mins                     Kenyon Ana, PT  Pager: [phone] Acute Rehab Dept [facility]): [phone]   11/20/2018    [facility] 11/20/2018, 11:10 AM

## 2018-11-20 NOTE — Progress Notes (Signed)
   11/20/18 1500  PT Visit Information  Last PT Received On 11/20/18--doing well, ready for d/c  Assistance Needed +1  History of Present Illness s/p L TKA  Precautions  Precautions Knee;Fall  Restrictions  Other Position/Activity Restrictions WBAT  Pain Assessment  Pain Assessment 0-10  Pain Score 2  Pain Location left knee  Pain Descriptors / Indicators Discomfort;Sore  Pain Intervention(s) Limited activity within patient's tolerance;Monitored during session;Premedicated before session  Cognition  Arousal/Alertness Awake/alert  Behavior During Therapy WFL for tasks assessed/performed  Overall Cognitive Status Within Functional Limits for tasks assessed  Bed Mobility  Bed Mobility Supine to Sit;Sit to Supine  Supine to sit Supervision  Sit to supine Supervision  General bed mobility comments cues to self assist with gait belt  Transfers  Overall transfer level Needs assistance  Equipment used Rolling walker (2 wheeled)  Transfers Sit to/from Stand  Sit to Stand Supervision  General transfer comment cues for hand placement and LLE position  Ambulation/Gait  Ambulation/Gait assistance Supervision  Gait Distance (Feet) 60 Feet  Assistive device Rolling walker (2 wheeled)  Gait Pattern/deviations Step-to pattern;Decreased weight shift to left  General Gait Details cues for sequence and RW position  Stairs Yes  Stairs assistance Min guard  Stair Management One rail Right;One rail Left;Step to pattern;Sideways  Number of Stairs 3  General stair comments cues for sequence  PT - End of Session  Equipment Utilized During Treatment Gait belt  Activity Tolerance Patient tolerated treatment well  Patient left in bed;with call bell/phone within reach  Nurse Communication  (ready fo rd/c)   PT - Assessment/Plan  PT Plan Current plan remains appropriate  PT Visit Diagnosis Difficulty in walking, not elsewhere classified (R26.2)  PT Frequency (ACUTE ONLY) 7X/week  Follow Up  Recommendations Follow surgeon's recommendation for DC plan and follow-up therapies  PT equipment Rolling walker with 5" wheels  AM-PAC PT "6 Clicks" Mobility Outcome Measure (Version 2)  Help needed turning from your back to your side while in a flat bed without using bedrails? 3  Help needed moving from lying on your back to sitting on the side of a flat bed without using bedrails? 3  Help needed moving to and from a bed to a chair (including a wheelchair)? 3  Help needed standing up from a chair using your arms (e.g., wheelchair or bedside chair)? 3  Help needed to walk in hospital room? 3  Help needed climbing 3-5 steps with a railing?  3  6 Click Score 18  Consider Recommendation of Discharge To: Home with [facility]  PT Goal Progression  Progress towards PT goals Progressing toward goals  Acute Rehab PT Goals  PT Goal Formulation With patient  Time For Goal Achievement 11/26/18  Potential to Achieve Goals Good  PT Time Calculation  PT Start Time (ACUTE ONLY) 1450  PT Stop Time (ACUTE ONLY) 1516  PT Time Calculation (min) (ACUTE ONLY) 26 min  PT General Charges  $$ ACUTE PT VISIT 1 Visit  PT Treatments  $Gait Training 23-37 mins

## 2018-11-20 NOTE — Progress Notes (Signed)
PATIENT ID: Jill Contreras  MRN: [id number]  DOB/AGE:  Dec 24, 1946 / 72 y.o.  1 Day Post-Op Procedure(s) (LRB): TOTAL KNEE ARTHROPLASTY (Left)    PROGRESS NOTE Subjective: Patient is alert, oriented, no Nausea, no Vomiting, yes passing gas. Taking PO well. Denies SOB, Chest or Calf Pain. Using Incentive Spirometer, PAS in place. Ambulate WBAT  With pt walking 45 ft in therapy, Patient reports pain as mild to moderate .    Objective: Vital signs in last 24 hours: Vitals:   11/19/18 2252 11/20/18 0238 11/20/18 0531 11/20/18 0802  BP:  116/60 (!) 101/49 (!) 111/51  Pulse: 65 76 66 75  Resp: 16 16    Temp:  97.8 F (36.6 C) (!) 97.5 F (36.4 C)   TempSrc:  Oral    SpO2: 100% 100% 100%   Weight:      Height:          Intake/Output from previous day: I/O last 3 completed shifts: In: 4124.5 [P.O.:320; I.V.:3541.1; IV Piggyback:263.3] Out: 3450 [Urine:3300; Blood:150]   Intake/Output this shift: No intake/output data recorded.   LABORATORY DATA: Recent Labs    11/19/18 1722 11/19/18 2201 11/20/18 0453 11/20/18 0729  WBC  --   --  12.3*  --   HGB  --   --  8.7*  --   HCT  --   --  27.8*  --   PLT  --   --  226  --   NA  --   --  136  --   K  --   --  3.9  --   CL  --   --  103  --   CO2  --   --  26  --   BUN  --   --  14  --   CREATININE  --   --  0.57  --   GLUCOSE  --   --  188*  --   GLUCAP 237* 165*  --  152*  CALCIUM  --   --  8.4*  --     Examination: Neurologically intact Neurovascular intact Sensation intact distally Intact pulses distally Dorsiflexion/Plantar flexion intact Incision: dressing C/D/I and no drainage No cellulitis present Compartment soft}  Assessment:   1 Day Post-Op Procedure(s) (LRB): TOTAL KNEE ARTHROPLASTY (Left) ADDITIONAL DIAGNOSIS: Expected Acute Blood Loss Anemia, Sleep Apnea Anticipated LOS equal to or greater than 2 midnights due to - Age 10 and older with one or more of the following:  - Obesity  - Expected need for  hospital services (PT, OT, Nursing) required for safe  discharge  - Anticipated need for postoperative skilled nursing care or inpatient rehab    Plan: PT/OT WBAT, AROM and PROM  DVT Prophylaxis:  SCDx72hrs, ASA 81 mg BID x 2 weeks DISCHARGE PLAN: Home DISCHARGE NEEDS: HHPT, Walker and 3-in-1 comode seat     Joanell Rising 11/20/2018, 8:21 AM

## 2018-11-20 NOTE — Progress Notes (Signed)
Pt has been provided with d/c instructions and prescriptions. After discussing the pt's plan of care upon d/c home, the pt reported no further questions or concerns.   The pt has one more session of PT left before she can be discharged.

## 2018-11-21 ENCOUNTER — Encounter (HOSPITAL_COMMUNITY): Payer: Self-pay | Admitting: Orthopedic Surgery

## 2018-11-21 DIAGNOSIS — E785 Hyperlipidemia, unspecified: Secondary | ICD-10-CM | POA: Diagnosis not present

## 2018-11-21 DIAGNOSIS — I10 Essential (primary) hypertension: Secondary | ICD-10-CM | POA: Diagnosis not present

## 2018-11-21 DIAGNOSIS — E119 Type 2 diabetes mellitus without complications: Secondary | ICD-10-CM | POA: Diagnosis not present

## 2018-11-21 DIAGNOSIS — G2581 Restless legs syndrome: Secondary | ICD-10-CM | POA: Diagnosis not present

## 2018-11-21 DIAGNOSIS — Z8673 Personal history of transient ischemic attack (TIA), and cerebral infarction without residual deficits: Secondary | ICD-10-CM | POA: Diagnosis not present

## 2018-11-21 DIAGNOSIS — Z471 Aftercare following joint replacement surgery: Secondary | ICD-10-CM | POA: Diagnosis not present

## 2018-11-21 DIAGNOSIS — M1711 Unilateral primary osteoarthritis, right knee: Secondary | ICD-10-CM | POA: Diagnosis not present

## 2018-11-21 DIAGNOSIS — Z96652 Presence of left artificial knee joint: Secondary | ICD-10-CM | POA: Diagnosis not present

## 2018-11-23 DIAGNOSIS — Z471 Aftercare following joint replacement surgery: Secondary | ICD-10-CM | POA: Diagnosis not present

## 2018-11-23 DIAGNOSIS — E119 Type 2 diabetes mellitus without complications: Secondary | ICD-10-CM | POA: Diagnosis not present

## 2018-11-23 DIAGNOSIS — G2581 Restless legs syndrome: Secondary | ICD-10-CM | POA: Diagnosis not present

## 2018-11-23 DIAGNOSIS — I10 Essential (primary) hypertension: Secondary | ICD-10-CM | POA: Diagnosis not present

## 2018-11-23 DIAGNOSIS — M1711 Unilateral primary osteoarthritis, right knee: Secondary | ICD-10-CM | POA: Diagnosis not present

## 2018-11-23 DIAGNOSIS — E785 Hyperlipidemia, unspecified: Secondary | ICD-10-CM | POA: Diagnosis not present

## 2018-11-26 DIAGNOSIS — Z471 Aftercare following joint replacement surgery: Secondary | ICD-10-CM | POA: Diagnosis not present

## 2018-11-26 DIAGNOSIS — E785 Hyperlipidemia, unspecified: Secondary | ICD-10-CM | POA: Diagnosis not present

## 2018-11-26 DIAGNOSIS — E119 Type 2 diabetes mellitus without complications: Secondary | ICD-10-CM | POA: Diagnosis not present

## 2018-11-26 DIAGNOSIS — I10 Essential (primary) hypertension: Secondary | ICD-10-CM | POA: Diagnosis not present

## 2018-11-26 DIAGNOSIS — M1711 Unilateral primary osteoarthritis, right knee: Secondary | ICD-10-CM | POA: Diagnosis not present

## 2018-11-26 DIAGNOSIS — G2581 Restless legs syndrome: Secondary | ICD-10-CM | POA: Diagnosis not present

## 2018-11-28 DIAGNOSIS — E785 Hyperlipidemia, unspecified: Secondary | ICD-10-CM | POA: Diagnosis not present

## 2018-11-28 DIAGNOSIS — Z471 Aftercare following joint replacement surgery: Secondary | ICD-10-CM | POA: Diagnosis not present

## 2018-11-28 DIAGNOSIS — G2581 Restless legs syndrome: Secondary | ICD-10-CM | POA: Diagnosis not present

## 2018-11-28 DIAGNOSIS — M1711 Unilateral primary osteoarthritis, right knee: Secondary | ICD-10-CM | POA: Diagnosis not present

## 2018-11-28 DIAGNOSIS — I10 Essential (primary) hypertension: Secondary | ICD-10-CM | POA: Diagnosis not present

## 2018-11-28 DIAGNOSIS — E119 Type 2 diabetes mellitus without complications: Secondary | ICD-10-CM | POA: Diagnosis not present

## 2018-11-30 DIAGNOSIS — Z471 Aftercare following joint replacement surgery: Secondary | ICD-10-CM | POA: Diagnosis not present

## 2018-11-30 DIAGNOSIS — E785 Hyperlipidemia, unspecified: Secondary | ICD-10-CM | POA: Diagnosis not present

## 2018-11-30 DIAGNOSIS — G2581 Restless legs syndrome: Secondary | ICD-10-CM | POA: Diagnosis not present

## 2018-11-30 DIAGNOSIS — E119 Type 2 diabetes mellitus without complications: Secondary | ICD-10-CM | POA: Diagnosis not present

## 2018-11-30 DIAGNOSIS — M1711 Unilateral primary osteoarthritis, right knee: Secondary | ICD-10-CM | POA: Diagnosis not present

## 2018-11-30 DIAGNOSIS — I10 Essential (primary) hypertension: Secondary | ICD-10-CM | POA: Diagnosis not present

## 2018-12-04 DIAGNOSIS — R262 Difficulty in walking, not elsewhere classified: Secondary | ICD-10-CM | POA: Diagnosis not present

## 2018-12-04 DIAGNOSIS — Z471 Aftercare following joint replacement surgery: Secondary | ICD-10-CM | POA: Diagnosis not present

## 2018-12-04 DIAGNOSIS — M25562 Pain in left knee: Secondary | ICD-10-CM | POA: Diagnosis not present

## 2018-12-04 DIAGNOSIS — Z96652 Presence of left artificial knee joint: Secondary | ICD-10-CM | POA: Diagnosis not present

## 2018-12-12 DIAGNOSIS — Z471 Aftercare following joint replacement surgery: Secondary | ICD-10-CM | POA: Diagnosis not present

## 2018-12-12 DIAGNOSIS — R262 Difficulty in walking, not elsewhere classified: Secondary | ICD-10-CM | POA: Diagnosis not present

## 2018-12-12 DIAGNOSIS — M25562 Pain in left knee: Secondary | ICD-10-CM | POA: Diagnosis not present

## 2018-12-13 DIAGNOSIS — M25562 Pain in left knee: Secondary | ICD-10-CM | POA: Diagnosis not present

## 2018-12-13 DIAGNOSIS — R262 Difficulty in walking, not elsewhere classified: Secondary | ICD-10-CM | POA: Diagnosis not present

## 2018-12-13 DIAGNOSIS — Z471 Aftercare following joint replacement surgery: Secondary | ICD-10-CM | POA: Diagnosis not present

## 2018-12-17 DIAGNOSIS — R262 Difficulty in walking, not elsewhere classified: Secondary | ICD-10-CM | POA: Diagnosis not present

## 2018-12-17 DIAGNOSIS — M25562 Pain in left knee: Secondary | ICD-10-CM | POA: Diagnosis not present

## 2018-12-17 DIAGNOSIS — Z471 Aftercare following joint replacement surgery: Secondary | ICD-10-CM | POA: Diagnosis not present

## 2018-12-21 DIAGNOSIS — Z471 Aftercare following joint replacement surgery: Secondary | ICD-10-CM | POA: Diagnosis not present

## 2018-12-21 DIAGNOSIS — M25562 Pain in left knee: Secondary | ICD-10-CM | POA: Diagnosis not present

## 2018-12-21 DIAGNOSIS — R262 Difficulty in walking, not elsewhere classified: Secondary | ICD-10-CM | POA: Diagnosis not present

## 2018-12-24 DIAGNOSIS — R262 Difficulty in walking, not elsewhere classified: Secondary | ICD-10-CM | POA: Diagnosis not present

## 2018-12-24 DIAGNOSIS — Z471 Aftercare following joint replacement surgery: Secondary | ICD-10-CM | POA: Diagnosis not present

## 2018-12-24 DIAGNOSIS — M25562 Pain in left knee: Secondary | ICD-10-CM | POA: Diagnosis not present

## 2018-12-27 DIAGNOSIS — M25562 Pain in left knee: Secondary | ICD-10-CM | POA: Diagnosis not present

## 2018-12-27 DIAGNOSIS — Z471 Aftercare following joint replacement surgery: Secondary | ICD-10-CM | POA: Diagnosis not present

## 2018-12-27 DIAGNOSIS — R262 Difficulty in walking, not elsewhere classified: Secondary | ICD-10-CM | POA: Diagnosis not present

## 2018-12-31 DIAGNOSIS — M25562 Pain in left knee: Secondary | ICD-10-CM | POA: Diagnosis not present

## 2018-12-31 DIAGNOSIS — R262 Difficulty in walking, not elsewhere classified: Secondary | ICD-10-CM | POA: Diagnosis not present

## 2018-12-31 DIAGNOSIS — Z471 Aftercare following joint replacement surgery: Secondary | ICD-10-CM | POA: Diagnosis not present

## 2019-01-03 DIAGNOSIS — R262 Difficulty in walking, not elsewhere classified: Secondary | ICD-10-CM | POA: Diagnosis not present

## 2019-01-03 DIAGNOSIS — M25562 Pain in left knee: Secondary | ICD-10-CM | POA: Diagnosis not present

## 2019-01-03 DIAGNOSIS — Z471 Aftercare following joint replacement surgery: Secondary | ICD-10-CM | POA: Diagnosis not present

## 2019-01-09 DIAGNOSIS — R262 Difficulty in walking, not elsewhere classified: Secondary | ICD-10-CM | POA: Diagnosis not present

## 2019-01-09 DIAGNOSIS — Z471 Aftercare following joint replacement surgery: Secondary | ICD-10-CM | POA: Diagnosis not present

## 2019-01-09 DIAGNOSIS — M25562 Pain in left knee: Secondary | ICD-10-CM | POA: Diagnosis not present

## 2019-01-14 ENCOUNTER — Institutional Professional Consult (permissible substitution): Payer: Medicare Other | Admitting: Neurology

## 2019-01-15 DIAGNOSIS — Z471 Aftercare following joint replacement surgery: Secondary | ICD-10-CM | POA: Diagnosis not present

## 2019-01-15 DIAGNOSIS — M25562 Pain in left knee: Secondary | ICD-10-CM | POA: Diagnosis not present

## 2019-01-15 DIAGNOSIS — R262 Difficulty in walking, not elsewhere classified: Secondary | ICD-10-CM | POA: Diagnosis not present

## 2019-01-23 DIAGNOSIS — M25562 Pain in left knee: Secondary | ICD-10-CM | POA: Diagnosis not present

## 2019-01-29 ENCOUNTER — Telehealth: Payer: Self-pay | Admitting: *Deleted

## 2019-01-29 NOTE — Telephone Encounter (Signed)
Received second request from [facility] for surgical clearance for right total knee arthroplasty. Refaxed the form that Dr Leta Baptist completed on 11/06/18 for same procedure. Received confirmation of fax transmission.

## 2019-01-30 DIAGNOSIS — Z471 Aftercare following joint replacement surgery: Secondary | ICD-10-CM | POA: Diagnosis not present

## 2019-01-30 DIAGNOSIS — R262 Difficulty in walking, not elsewhere classified: Secondary | ICD-10-CM | POA: Diagnosis not present

## 2019-01-30 DIAGNOSIS — M25562 Pain in left knee: Secondary | ICD-10-CM | POA: Diagnosis not present

## 2019-02-05 DIAGNOSIS — I509 Heart failure, unspecified: Secondary | ICD-10-CM | POA: Diagnosis not present

## 2019-02-05 DIAGNOSIS — R079 Chest pain, unspecified: Secondary | ICD-10-CM | POA: Diagnosis not present

## 2019-02-07 ENCOUNTER — Other Ambulatory Visit: Payer: Self-pay | Admitting: Orthopedic Surgery

## 2019-02-17 ENCOUNTER — Encounter: Payer: Self-pay | Admitting: Neurology

## 2019-02-18 ENCOUNTER — Ambulatory Visit (INDEPENDENT_AMBULATORY_CARE_PROVIDER_SITE_OTHER): Payer: Medicare Other | Admitting: Neurology

## 2019-02-18 ENCOUNTER — Encounter: Payer: Self-pay | Admitting: Neurology

## 2019-02-18 VITALS — BP 115/62 | HR 68 | Ht 65.0 in | Wt 180.0 lb

## 2019-02-18 DIAGNOSIS — R351 Nocturia: Secondary | ICD-10-CM | POA: Diagnosis not present

## 2019-02-18 DIAGNOSIS — E663 Overweight: Secondary | ICD-10-CM

## 2019-02-18 DIAGNOSIS — G4733 Obstructive sleep apnea (adult) (pediatric): Secondary | ICD-10-CM | POA: Diagnosis not present

## 2019-02-18 DIAGNOSIS — G479 Sleep disorder, unspecified: Secondary | ICD-10-CM

## 2019-02-18 DIAGNOSIS — Z9989 Dependence on other enabling machines and devices: Secondary | ICD-10-CM | POA: Diagnosis not present

## 2019-02-18 DIAGNOSIS — G4700 Insomnia, unspecified: Secondary | ICD-10-CM

## 2019-02-18 DIAGNOSIS — Z0000 Encounter for general adult medical examination without abnormal findings: Secondary | ICD-10-CM | POA: Diagnosis not present

## 2019-02-18 NOTE — Patient Instructions (Signed)

## 2019-02-18 NOTE — Progress Notes (Signed)
Subjective:    Patient ID: Jill Contreras is a 73 y.o. female.  HPI     Star Age, MD, PhD [facility] [address] [place], [place] [address]  Dear Bonnita Levan,   I saw your patient, Jill Contreras, upon your kind request in my sleep clinic today for initial consultation of her sleep disorder, in particular, concern for underlying obstructive sleep apnea. The patient is unaccompanied today. As you know, [name] is a 73 year old right-handed woman with an underlying medical history of hypertension, hyperlipidemia, hypothyroidism, osteoarthritis with status post left total knee replacement surgery in November 2019 and with pending knee replacement surgery next month, and overweight state, who was previously diagnosed with obstructive sleep apnea and placed on CPAP therapy. I reviewed your office note from 11/06/2018. Prior sleep study results are not available for my review today. She reports that sleep study testing was about 7 years ago. I reviewed her CPAP compliance data from her machine from 01/19/2019 through 02/17/2019 which is the last 30 days, during which time she used her CPAP every night with percent used days greater than 4 hours and 100%, indicating superb compliance with an average usage of 8 hours and 57 minutes, residual AHI information is not available on this report, she is on a pressure of 9 cm, leak information is also not available on this report. Ramp starts at 4 cm and ramp time is 20 minutes. Her Epworth sleepiness score is 6 out of 24 today, fatigue score is 49 out of 63. She is widowed and lives alone, she has 4 children. She is a nonsmoker and does not utilize alcohol and drinks caffeine in the form of coffee and soda, decaff coffee mostly. She has had weight gain.  Her original sleep apnea Dx was about 15 years ago. She has trouble going to sleep and staying asleep. She received a new machine about 7 years ago. She would like to  get a new machine, but would favor waiting till after her R TKA.  She is using a medium Eson nasal mask, tried the pillows in the past. She had L knee surgery in November 2019. She has had more sleep issues since her surgery, takes OTC melatonin. She has nocturia x 2.  BT is around 10 or 10:30, latest by 11 PM. She sleeps till 9:30 AM. No HAs, both sons have OSA.   Her Past Medical History Is Significant For: Past Medical History:  Diagnosis Date  . Allergy to poison ivy   . Arthritis    knees  . 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 Atypical ductal hyperplasia of right breast 12/04/2017  . Cough 11/28/2017  . Dental bridge present    upper  . Dental crowns present   . Female cystocele   . Heartburn   . History of TIA (transient ischemic attack)   . Hyperlipidemia   . Hypertension    has just added another med., states has been on medication x 10-15 yrs.  . Hypothyroidism   . Non-insulin dependent type 2 diabetes mellitus ([place])   . Osteoarthritis    knees  . Restless leg syndrome   . Sleep apnea    uses CPAP nightly    Her Past Surgical History Is Significant For: Past Surgical History:  Procedure Laterality Date  . BREAST LUMPECTOMY WITH RADIOACTIVE SEED LOCALIZATION Right 12/04/2017   Procedure: RIGHT BREAST LUMPECTOMY WITH RADIOACTIVE SEED LOCALIZATION ERAS PATHWAY;  Surgeon: Fanny Skates,  MD;  Location: [facility];  Service: General;  Laterality: Right;  . CATARACT EXTRACTION W/ INTRAOCULAR LENS  IMPLANT, BILATERAL    . COLONOSCOPY  2013  . Colonscopy  2019  . FACIAL RECONSTRUCTION SURGERY     age 23  . KNEE ARTHROSCOPY Left   . TOTAL KNEE ARTHROPLASTY Left 11/19/2018   Procedure: TOTAL KNEE ARTHROPLASTY;  Surgeon: Frederik Pear, MD;  Location: [facility];  Service: Orthopedics;  Laterality: Left;  with block    Her Family History Is Significant For: Family History  Problem Relation Age of Onset  . Cancer Mother        breast  . Lymphoma Mother   .  Heart disease Father        blockage in carotid  . Heart disease Brother        smoker    Her Social History Is Significant For: Social History   Socioeconomic History  . Marital status: Widowed    Spouse name: Not on file  . Number of children: 4  . Years of education: 34  . Highest education level: Not on file  Occupational History    Comment: na  Social Needs  . Financial resource strain: Not on file  . Food insecurity:    Worry: Not on file    Inability: Not on file  . Transportation needs:    Medical: Not on file    Non-medical: Not on file  Tobacco Use  . Smoking status: Never Smoker  . Smokeless tobacco: Never Used  Substance and Sexual Activity  . Alcohol use: No  . Drug use: No  . Sexual activity: Not on file  Lifestyle  . Physical activity:    Days per week: Not on file    Minutes per session: Not on file  . Stress: Not on file  Relationships  . Social connections:    Talks on phone: Not on file    Gets together: Not on file    Attends religious service: Not on file    Active member of club or organization: Not on file    Attends meetings of clubs or organizations: Not on file    Relationship status: Not on file  Other Topics Concern  . Not on file  Social History Narrative   Lives alone   Caffeine- coffee, Coke, 2 daily    Her Allergies Are:  Allergies  Allergen Reactions  . Mango Flavor Swelling    SWELLING OF FACE AND EYES  . Penicillins Other (See Comments)    SWELLING OF LIPS   . Poison Ivy Extract     Pt internalizes reaction -   . Lisinopril Cough  :   Her Current Medications Are:  Outpatient Encounter Medications as of 02/18/2019  Medication Sig  . amLODipine (NORVASC) 2.5 MG tablet Take 2.5 mg by mouth at bedtime.  Marland Kitchen aspirin EC 81 MG tablet Take 1 tablet (81 mg total) by mouth 2 (two) times daily.  Marland Kitchen atorvastatin (LIPITOR) 80 MG tablet Take 80 mg by mouth at bedtime.   . carvedilol (COREG) 6.25 MG tablet Take 6.25 mg by mouth 2  (two) times daily with a meal.  . ENTRESTO 97-103 MG Take 1 tablet by mouth 2 (two) times daily.   Marland Kitchen ezetimibe (ZETIA) 10 MG tablet Take 10 mg by mouth daily.  Marland Kitchen levothyroxine (SYNTHROID, LEVOTHROID) 100 MCG tablet Take 100 mcg by mouth daily before breakfast.  . metFORMIN (GLUCOPHAGE) 1000 MG tablet Take 500 mg by mouth 3 (  three) times daily.   . Multiple Vitamins-Minerals (ALIVE WOMENS ENERGY PO) Take 1 tablet by mouth daily.  Marland Kitchen omeprazole (PRILOSEC) 20 MG capsule Take 20 mg by mouth daily.  Marland Kitchen oxyCODONE-acetaminophen (PERCOCET/ROXICET) 5-325 MG tablet Take 1 tablet by mouth every 4 (four) hours as needed for severe pain.  . pramipexole (MIRAPEX) 1 MG tablet Take 1 mg by mouth at bedtime.   . repaglinide (PRANDIN) 1 MG tablet Take 1 mg by mouth 3 (three) times daily before meals.  . sitaGLIPtin (JANUVIA) 100 MG tablet Take 100 mg by mouth daily.  Marland Kitchen tiZANidine (ZANAFLEX) 2 MG tablet Take 1 tablet (2 mg total) by mouth every 6 (six) hours as needed.  . Turmeric 500 MG CAPS Take 1 capsule by mouth daily.   No facility-administered encounter medications on file as of 02/18/2019.   :  Review of Systems:  Out of a complete 14 point review of systems, all are reviewed and negative with the exception of these symptoms as listed below: Review of Systems  Neurological:       Pt presents today to discuss her sleep. Pt has a cpap that is about 73 years old. Her last sleep study was 7 years ago. She uses [facility] as her DME. Pt is wondering if she needs a new cpap.  Epworth Sleepiness Scale 0= would never doze 1= slight chance of dozing 2= moderate chance of dozing 3= high chance of dozing  Sitting and reading: 1 Watching TV: 0 Sitting inactive in a public place (ex. Theater or meeting): 1 As a passenger in a car for an hour without a break: 1 Lying down to rest in the afternoon: 3 Sitting and talking to someone: 0 Sitting quietly after lunch (no alcohol): 0 In a car, while stopped in traffic:  0 Total: 6     Objective:  Neurological Exam  Physical Exam Physical Examination:   Vitals:   02/18/19 1043  BP: 115/62  Pulse: 68    General Examination: The patient is a very pleasant 73 y.o. female in no acute distress. She appears well-developed and well-nourished and well groomed.   HEENT: Normocephalic, atraumatic, pupils are equal, round and reactive to light and accommodation. Extraocular tracking is good without limitation to gaze excursion or nystagmus noted. Normal smooth pursuit is noted. Hearing is grossly intact. Face is symmetric with normal facial animation and normal facial sensation. Speech is clear with no dysarthria noted. There is no hypophonia. There is no lip, neck/head, jaw or voice tremor. Neck is supple with full range of passive and active motion. There are no carotid bruits on auscultation. Oropharynx exam reveals: mild mouth dryness, good dental hygiene and mild airway crowding, due tosmall airway entry, tonsils of 1+, Mallampati is class III. Neck circumference is 14-3/8 inches. Tongue protrudes centrally and palate elevates symmetrically.   Chest: Clear to auscultation without wheezing, rhonchi or crackles noted.  Heart: S1+S2+0, regular and normal without murmurs, rubs or gallops noted.   Abdomen: Soft, non-tender and non-distended with normal bowel sounds appreciated on auscultation.  Extremities: There is no pitting edema in the distal lower extremities bilaterally. Pedal pulses are intact.  Skin: Warm and dry without trophic changes noted.  Musculoskeletal: exam reveals R knee discomfort.   Neurologically:  Mental status: The patient is awake, alert and oriented in all 4 spheres. Her immediate and remote memory, attention, language skills and fund of knowledge are appropriate. There is no evidence of aphasia, agnosia, apraxia or anomia. Speech is clear with  normal prosody and enunciation. Thought process is linear. Mood is normal and affect is  normal.  Cranial nerves II - XII are as described above under HEENT exam. In addition: shoulder shrug is normal with equal shoulder height noted. Motor exam: Normal bulk, strength and tone is noted. There is no drift, tremor or rebound. Fine motor skills and coordination: intact with normal finger taps, normal hand movements, normal rapid alternating patting, normal foot taps and normal foot agility.  Cerebellar testing: No dysmetria or intention tremor.  Sensory exam: intact to light touch.  Gait, station and balance: She stands easily. No veering to one side is noted. No leaning to one side is noted. Posture is age-appropriate and stance is narrow based. Gait shows normal stride length and normal pace. No problems turning are noted.   Assessment and Plan:  In summary, LEONDA CRISTO is a very pleasant 73 y.o.-year old female with an underlying medical history of hypertension, hyperlipidemia, hypothyroidism, osteoarthritis with status post left total knee replacement surgery in November 2019 and with pending knee replacement surgery next month, and overweight state, who presents for evaluation of her obstructive sleep apnea. She was originally diagnosed about 15 years ago. She is currently using CPAP and is fully compliant. Nevertheless, she has had some sleep difficulties including difficulty initiating and maintaining sleep, nonrestorative sleep. Some of her issues now improve as she is less uncomfortable after her knee surgery. She had recent left total knee replacement and is going to have a right total knee replacement next month. She would benefit from reevaluation of her obstructive sleep apnea and also qualifies for a new machine and hopefully after that. Unfortunately, leak information and residual AHI information is not available for my review on this current download from her machine. She is commended for her full compliance with treatment, current pressure of 9 cm. We mutually agreed to try to  bring her back in for a sleep study after she has had her knee replacement surgery, she is encouraged to call us in or around April or early May.  We talked about sleep apnea and its prognosis and treatment options. She is advised to continue with her current machine and mask and the current settings. I plan to see her back once we have been able to bring her in for sleep study. I answered all her questions today and she was in agreement with the plan. Thank you very much for allowing me to participate in the care of this nice patient. If I can be of any further assistance to you please do not hesitate to talk to me.   Sincerely,   Star Age, MD, PhD

## 2019-03-06 DIAGNOSIS — E1100 Type 2 diabetes mellitus with hyperosmolarity without nonketotic hyperglycemic-hyperosmolar coma (NKHHC): Secondary | ICD-10-CM | POA: Diagnosis not present

## 2019-03-06 DIAGNOSIS — I10 Essential (primary) hypertension: Secondary | ICD-10-CM | POA: Diagnosis not present

## 2019-03-06 DIAGNOSIS — E785 Hyperlipidemia, unspecified: Secondary | ICD-10-CM | POA: Diagnosis not present

## 2019-03-06 DIAGNOSIS — D649 Anemia, unspecified: Secondary | ICD-10-CM | POA: Diagnosis not present

## 2019-03-07 ENCOUNTER — Other Ambulatory Visit (HOSPITAL_COMMUNITY): Payer: Self-pay | Admitting: *Deleted

## 2019-03-07 DIAGNOSIS — E785 Hyperlipidemia, unspecified: Secondary | ICD-10-CM | POA: Diagnosis not present

## 2019-03-07 DIAGNOSIS — E782 Mixed hyperlipidemia: Secondary | ICD-10-CM | POA: Diagnosis not present

## 2019-03-07 DIAGNOSIS — I10 Essential (primary) hypertension: Secondary | ICD-10-CM | POA: Diagnosis not present

## 2019-03-07 DIAGNOSIS — D649 Anemia, unspecified: Secondary | ICD-10-CM | POA: Diagnosis not present

## 2019-03-07 DIAGNOSIS — G4730 Sleep apnea, unspecified: Secondary | ICD-10-CM | POA: Diagnosis not present

## 2019-03-07 NOTE — Progress Notes (Signed)
EKG 11-14-18 Epic CHEST XRAY 11-14-28 Epic LOV DR [facility] 01-22-18 EPIC

## 2019-03-08 ENCOUNTER — Other Ambulatory Visit: Payer: Self-pay | Admitting: Orthopedic Surgery

## 2019-03-08 NOTE — Care Plan (Signed)
Patient known to me from prior surgery. Spoke with her pre-op. She plans to discharge to home with family and HHPT.  SHe will transition to OPPT according to plan. SHe has all equipment from prior surgery. CPM ordered. She is aware of plan and agreeable.  CHoice offered.    Ladell Heads, [place]

## 2019-03-11 ENCOUNTER — Encounter (HOSPITAL_COMMUNITY): Payer: Self-pay

## 2019-03-11 ENCOUNTER — Encounter (HOSPITAL_COMMUNITY)
Admission: RE | Admit: 2019-03-11 | Discharge: 2019-03-11 | Disposition: A | Discharge status: Home / Self Care | Payer: Medicare Other | Source: Ambulatory Visit | Attending: Orthopedic Surgery | Admitting: Orthopedic Surgery

## 2019-03-11 ENCOUNTER — Other Ambulatory Visit: Payer: Self-pay

## 2019-03-11 DIAGNOSIS — Z01812 Encounter for preprocedural laboratory examination: Secondary | ICD-10-CM | POA: Diagnosis not present

## 2019-03-11 LAB — CBC
HCT: 34.1 % — ABNORMAL LOW (ref 36.0–46.0)
Hemoglobin: 10 g/dL — ABNORMAL LOW (ref 12.0–15.0)
MCH: 25.1 pg — ABNORMAL LOW (ref 26.0–34.0)
MCHC: 29.3 g/dL — AB (ref 30.0–36.0)
MCV: 85.7 fL (ref 80.0–100.0)
NRBC: 0 % (ref 0.0–0.2)
PLATELETS: 261 10*3/uL (ref 150–400)
RBC: 3.98 MIL/uL (ref 3.87–5.11)
RDW: 15.5 % (ref 11.5–15.5)
WBC: 7.9 10*3/uL (ref 4.0–10.5)

## 2019-03-11 LAB — BASIC METABOLIC PANEL
Anion gap: 10 (ref 5–15)
BUN: 15 mg/dL (ref 8–23)
CALCIUM: 9.3 mg/dL (ref 8.9–10.3)
CO2: 24 mmol/L (ref 22–32)
Chloride: 106 mmol/L (ref 98–111)
Creatinine, Ser: 0.53 mg/dL (ref 0.44–1.00)
GFR calc non Af Amer: >60 mL/min (ref >60)
Glucose, Bld: 150 mg/dL — ABNORMAL HIGH (ref 70–99)
Potassium: 4.3 mmol/L (ref 3.5–5.1)
SODIUM: 140 mmol/L (ref 135–145)

## 2019-03-11 LAB — SURGICAL PCR SCREEN
MRSA, PCR: NEGATIVE
STAPHYLOCOCCUS AUREUS: NEGATIVE

## 2019-03-11 LAB — GLUCOSE, CAPILLARY: GLUCOSE-CAPILLARY: 140 mg/dL — AB (ref 70–99)

## 2019-03-11 NOTE — Progress Notes (Signed)
CMET, CBC WITH DIF AND PLATLET, HEMAGLOBIN A1 C DONE 03-06-2019 DR BLAND ON CHART

## 2019-03-11 NOTE — Patient Instructions (Addendum)
Jill Contreras  03/11/2019   Your procedure is scheduled on: 03-15-2019  Report to [facility] Main  Entrance  Report to [place] at 530 AM    Call this number if you have problems the morning of surgery [phone]   Remember: Do not eat food or drink liquids :After Midnight. BRUSH YOUR TEETH MORNING OF SURGERY AND RINSE YOUR MOUTH OUT, NO CHEWING GUM CANDY OR MINTS.   BRING CPAP MASK AND TUBING  Take these medicines the morning of surgery with A SIP OF WATER: CARVEDILOL, ENTRESTO, ZETIA, LEVOTHYROXINE DO NOT TAKE ANY DIABETIC MEDICATIONS DAY OF YOUR SURGERY                               You may not have any metal on your body including hair pins and              piercings  Do not wear jewelry, make-up, lotions, powders or perfumes, deodorant             Do not wear nail polish.  Do not shave  48 hours prior to surgery.              Men may shave face and neck.   Do not bring valuables to the hospital. [place].  Contacts, dentures or bridgework may not be worn into surgery.  Leave suitcase in the car. After surgery it may be brought to your room.     Patients discharged the day of surgery will not be allowed to drive home. IF YOU ARE HAVING SURGERY AND GOING HOME THE SAME DAY, YOU MUST HAVE AN ADULT TO DRIVE YOU HOME AND BE WITH YOU FOR 24 HOURS. YOU MAY GO HOME BY TAXI OR UBER OR ORTHERWISE, BUT AN ADULT MUST ACCOMPANY YOU HOME AND STAY WITH YOU FOR 24 HOURS.  Name and phone number of your driver:  Special Instructions: N/A              Please read over the following fact sheets you were given: _____________________________________________________________________ How to Manage Your Diabetes Before and After Surgery  Why is it important to control my blood sugar before and after surgery? . Improving blood sugar levels before and after surgery helps healing and can limit problems. . A way of improving  blood sugar control is eating a healthy diet by: o  Eating less sugar and carbohydrates o  Increasing activity/exercise o  Talking with your doctor about reaching your blood sugar goals . High blood sugars (greater than 180 mg/dL) can raise your risk of infections and slow your recovery, so you will need to focus on controlling your diabetes during the weeks before surgery. . Make sure that the doctor who takes care of your diabetes knows about your planned surgery including the date and location.  How do I manage my blood sugar before surgery? . Check your blood sugar at least 4 times a day, starting 2 days before surgery, to make sure that the level is not too high or low. o Check your blood sugar the morning of your surgery when you wake up and every 2 hours until you get to the Short Stay unit. . If your blood sugar is less than 70  mg/dL, you will need to treat for low blood sugar: o Do not take insulin. o Treat a low blood sugar (less than 70 mg/dL) with  cup of clear juice (cranberry or apple), 4 glucose tablets, OR glucose gel. o Recheck blood sugar in 15 minutes after treatment (to make sure it is greater than 70 mg/dL). If your blood sugar is not greater than 70 mg/dL on recheck, call [phone] for further instructions. . Report your blood sugar to the short stay nurse when you get to Short Stay.  . If you are admitted to the hospital after surgery: o Your blood sugar will be checked by the staff and you will probably be given insulin after surgery (instead of oral diabetes medicines) to make sure you have good blood sugar levels. o The goal for blood sugar control after surgery is 80-180 mg/dL.   WHAT DO I DO ABOUT MY DIABETES MEDICATION?  Marland Kitchen Do not take oral diabetes medicines (pills) the morning of surgery.  . THE DAY BEFORE SURGERY TAKE METFORMIN, JANUVIA, AND REPAGLINIDE AS USUAL   THE MORNING OF SURGERY DO NOT TAKE ANY DIABETIC MEDS . Marland Kitchen                [place]  - Preparing for Surgery Before surgery, you can play an important role.  Because skin is not sterile, your skin needs to be as free of germs as possible.  You can reduce the number of germs on your skin by washing with CHG (chlorahexidine gluconate) soap before surgery.  CHG is an antiseptic cleaner which kills germs and bonds with the skin to continue killing germs even after washing. Please DO NOT use if you have an allergy to CHG or antibacterial soaps.  If your skin becomes reddened/irritated stop using the CHG and inform your nurse when you arrive at Short Stay. Do not shave (including legs and underarms) for at least 48 hours prior to the first CHG shower.  You may shave your face/neck. Please follow these instructions carefully:  1.  Shower with CHG Soap the night before surgery and the  morning of Surgery.  2.  If you choose to wash your hair, wash your hair first as usual with your  normal  shampoo.  3.  After you shampoo, rinse your hair and body thoroughly to remove the  shampoo.                           4.  Use CHG as you would any other liquid soap.  You can apply chg directly  to the skin and wash                       Gently with a scrungie or clean washcloth.  5.  Apply the CHG Soap to your body ONLY FROM THE NECK DOWN.   Do not use on face/ open                           Wound or open sores. Avoid contact with eyes, ears mouth and genitals (private parts).                       Wash face,  Genitals (private parts) with your normal soap.             6.  Wash thoroughly, paying special attention to the area where  your surgery  will be performed.  7.  Thoroughly rinse your body with warm water from the neck down.  8.  DO NOT shower/wash with your normal soap after using and rinsing off  the CHG Soap.                9.  Pat yourself dry with a clean towel.            10.  Wear clean pajamas.            11.  Place clean sheets on your bed the night of your first shower and do not  sleep  with pets. Day of Surgery : Do not apply any lotions/deodorants the morning of surgery.  Please wear clean clothes to the hospital/surgery center.  FAILURE TO FOLLOW THESE INSTRUCTIONS MAY RESULT IN THE CANCELLATION OF YOUR SURGERY PATIENT SIGNATURE_________________________________  NURSE SIGNATURE__________________________________  ________________________________________________________________________

## 2019-03-15 ENCOUNTER — Ambulatory Visit (HOSPITAL_COMMUNITY): Admission: RE | Admit: 2019-03-15 | Payer: Medicare Other | Source: Home / Self Care | Admitting: Orthopedic Surgery

## 2019-03-15 ENCOUNTER — Encounter (HOSPITAL_COMMUNITY): Admission: RE | Payer: Self-pay | Source: Home / Self Care

## 2019-03-15 SURGERY — ARTHROPLASTY, KNEE, TOTAL
Anesthesia: Spinal | Laterality: Right

## 2019-04-10 DIAGNOSIS — D649 Anemia, unspecified: Secondary | ICD-10-CM | POA: Diagnosis not present

## 2019-05-25 DIAGNOSIS — K219 Gastro-esophageal reflux disease without esophagitis: Secondary | ICD-10-CM | POA: Diagnosis not present

## 2019-05-25 DIAGNOSIS — E1169 Type 2 diabetes mellitus with other specified complication: Secondary | ICD-10-CM | POA: Diagnosis not present

## 2019-05-25 DIAGNOSIS — E038 Other specified hypothyroidism: Secondary | ICD-10-CM | POA: Diagnosis not present

## 2019-05-25 DIAGNOSIS — E782 Mixed hyperlipidemia: Secondary | ICD-10-CM | POA: Diagnosis not present

## 2019-05-25 DIAGNOSIS — G4730 Sleep apnea, unspecified: Secondary | ICD-10-CM | POA: Diagnosis not present

## 2019-05-25 DIAGNOSIS — I10 Essential (primary) hypertension: Secondary | ICD-10-CM | POA: Diagnosis not present

## 2019-06-13 DIAGNOSIS — D500 Iron deficiency anemia secondary to blood loss (chronic): Secondary | ICD-10-CM | POA: Diagnosis not present

## 2019-06-13 DIAGNOSIS — K210 Gastro-esophageal reflux disease with esophagitis: Secondary | ICD-10-CM | POA: Diagnosis not present

## 2019-06-18 DIAGNOSIS — E1169 Type 2 diabetes mellitus with other specified complication: Secondary | ICD-10-CM | POA: Diagnosis not present

## 2019-06-18 DIAGNOSIS — I10 Essential (primary) hypertension: Secondary | ICD-10-CM | POA: Diagnosis not present

## 2019-07-23 DIAGNOSIS — N6099 Unspecified benign mammary dysplasia of unspecified breast: Secondary | ICD-10-CM | POA: Diagnosis not present

## 2019-07-23 DIAGNOSIS — R921 Mammographic calcification found on diagnostic imaging of breast: Secondary | ICD-10-CM | POA: Diagnosis not present

## 2019-07-23 DIAGNOSIS — Z803 Family history of malignant neoplasm of breast: Secondary | ICD-10-CM | POA: Diagnosis not present

## 2019-08-01 DIAGNOSIS — Z1159 Encounter for screening for other viral diseases: Secondary | ICD-10-CM | POA: Diagnosis not present

## 2019-08-06 DIAGNOSIS — D500 Iron deficiency anemia secondary to blood loss (chronic): Secondary | ICD-10-CM | POA: Diagnosis not present

## 2019-08-06 DIAGNOSIS — K449 Diaphragmatic hernia without obstruction or gangrene: Secondary | ICD-10-CM | POA: Diagnosis not present

## 2019-08-06 DIAGNOSIS — K219 Gastro-esophageal reflux disease without esophagitis: Secondary | ICD-10-CM | POA: Diagnosis not present

## 2019-08-06 DIAGNOSIS — Z800 Family history of malignant neoplasm of digestive organs: Secondary | ICD-10-CM | POA: Diagnosis not present

## 2019-08-06 DIAGNOSIS — D509 Iron deficiency anemia, unspecified: Secondary | ICD-10-CM | POA: Diagnosis not present

## 2019-08-20 ENCOUNTER — Telehealth: Payer: Self-pay

## 2019-08-20 NOTE — Telephone Encounter (Signed)
We have attempted to call the patient four times to schedule sleep study.  Patient has been unavailable at the phone numbers we have on file and has not returned our calls.  If patient calls back we will schedule them for their sleep study.  

## 2019-10-11 DIAGNOSIS — I10 Essential (primary) hypertension: Secondary | ICD-10-CM | POA: Diagnosis not present

## 2019-10-11 DIAGNOSIS — E1169 Type 2 diabetes mellitus with other specified complication: Secondary | ICD-10-CM | POA: Diagnosis not present

## 2019-10-11 DIAGNOSIS — E038 Other specified hypothyroidism: Secondary | ICD-10-CM | POA: Diagnosis not present

## 2019-10-11 DIAGNOSIS — E782 Mixed hyperlipidemia: Secondary | ICD-10-CM | POA: Diagnosis not present

## 2019-10-11 DIAGNOSIS — G4730 Sleep apnea, unspecified: Secondary | ICD-10-CM | POA: Diagnosis not present

## 2019-10-15 DIAGNOSIS — E1169 Type 2 diabetes mellitus with other specified complication: Secondary | ICD-10-CM | POA: Diagnosis not present

## 2019-10-15 DIAGNOSIS — I10 Essential (primary) hypertension: Secondary | ICD-10-CM | POA: Diagnosis not present

## 2019-10-15 DIAGNOSIS — G4730 Sleep apnea, unspecified: Secondary | ICD-10-CM | POA: Diagnosis not present

## 2019-10-15 DIAGNOSIS — D649 Anemia, unspecified: Secondary | ICD-10-CM | POA: Diagnosis not present

## 2019-10-15 DIAGNOSIS — Z23 Encounter for immunization: Secondary | ICD-10-CM | POA: Diagnosis not present

## 2019-12-23 DIAGNOSIS — I10 Essential (primary) hypertension: Secondary | ICD-10-CM | POA: Diagnosis not present

## 2019-12-23 DIAGNOSIS — E782 Mixed hyperlipidemia: Secondary | ICD-10-CM | POA: Diagnosis not present

## 2019-12-23 DIAGNOSIS — G4730 Sleep apnea, unspecified: Secondary | ICD-10-CM | POA: Diagnosis not present

## 2019-12-23 DIAGNOSIS — E1169 Type 2 diabetes mellitus with other specified complication: Secondary | ICD-10-CM | POA: Diagnosis not present

## 2020-01-09 DIAGNOSIS — E785 Hyperlipidemia, unspecified: Secondary | ICD-10-CM | POA: Diagnosis not present

## 2020-01-09 DIAGNOSIS — G4733 Obstructive sleep apnea (adult) (pediatric): Secondary | ICD-10-CM | POA: Diagnosis not present

## 2020-01-09 DIAGNOSIS — E119 Type 2 diabetes mellitus without complications: Secondary | ICD-10-CM | POA: Diagnosis not present

## 2020-01-09 DIAGNOSIS — R232 Flushing: Secondary | ICD-10-CM | POA: Diagnosis not present

## 2020-01-09 DIAGNOSIS — G2581 Restless legs syndrome: Secondary | ICD-10-CM | POA: Diagnosis not present

## 2020-01-09 DIAGNOSIS — E039 Hypothyroidism, unspecified: Secondary | ICD-10-CM | POA: Diagnosis not present

## 2020-01-09 DIAGNOSIS — I10 Essential (primary) hypertension: Secondary | ICD-10-CM | POA: Diagnosis not present

## 2020-02-10 DIAGNOSIS — E785 Hyperlipidemia, unspecified: Secondary | ICD-10-CM | POA: Diagnosis not present

## 2020-02-10 DIAGNOSIS — I10 Essential (primary) hypertension: Secondary | ICD-10-CM | POA: Diagnosis not present

## 2020-02-29 DIAGNOSIS — Z23 Encounter for immunization: Secondary | ICD-10-CM | POA: Diagnosis not present

## 2020-03-09 DIAGNOSIS — G4733 Obstructive sleep apnea (adult) (pediatric): Secondary | ICD-10-CM | POA: Diagnosis not present

## 2020-03-09 DIAGNOSIS — E039 Hypothyroidism, unspecified: Secondary | ICD-10-CM | POA: Diagnosis not present

## 2020-03-09 DIAGNOSIS — E785 Hyperlipidemia, unspecified: Secondary | ICD-10-CM | POA: Diagnosis not present

## 2020-03-09 DIAGNOSIS — I10 Essential (primary) hypertension: Secondary | ICD-10-CM | POA: Diagnosis not present

## 2020-03-09 DIAGNOSIS — G2581 Restless legs syndrome: Secondary | ICD-10-CM | POA: Diagnosis not present

## 2020-03-09 DIAGNOSIS — E119 Type 2 diabetes mellitus without complications: Secondary | ICD-10-CM | POA: Diagnosis not present

## 2020-03-09 DIAGNOSIS — R232 Flushing: Secondary | ICD-10-CM | POA: Diagnosis not present

## 2020-03-11 DIAGNOSIS — E785 Hyperlipidemia, unspecified: Secondary | ICD-10-CM | POA: Diagnosis not present

## 2020-03-11 DIAGNOSIS — I10 Essential (primary) hypertension: Secondary | ICD-10-CM | POA: Diagnosis not present

## 2020-03-11 DIAGNOSIS — E039 Hypothyroidism, unspecified: Secondary | ICD-10-CM | POA: Diagnosis not present

## 2020-03-11 DIAGNOSIS — R232 Flushing: Secondary | ICD-10-CM | POA: Diagnosis not present

## 2020-03-11 DIAGNOSIS — E119 Type 2 diabetes mellitus without complications: Secondary | ICD-10-CM | POA: Diagnosis not present

## 2020-03-23 DIAGNOSIS — G4733 Obstructive sleep apnea (adult) (pediatric): Secondary | ICD-10-CM | POA: Diagnosis not present

## 2020-03-23 DIAGNOSIS — Z9989 Dependence on other enabling machines and devices: Secondary | ICD-10-CM | POA: Diagnosis not present

## 2020-03-23 DIAGNOSIS — Z808 Family history of malignant neoplasm of other organs or systems: Secondary | ICD-10-CM | POA: Diagnosis not present

## 2020-03-23 DIAGNOSIS — E119 Type 2 diabetes mellitus without complications: Secondary | ICD-10-CM | POA: Diagnosis not present

## 2020-03-24 DIAGNOSIS — E119 Type 2 diabetes mellitus without complications: Secondary | ICD-10-CM | POA: Diagnosis not present

## 2020-03-28 DIAGNOSIS — Z23 Encounter for immunization: Secondary | ICD-10-CM | POA: Diagnosis not present

## 2020-07-20 DIAGNOSIS — E663 Overweight: Secondary | ICD-10-CM | POA: Diagnosis not present

## 2020-07-20 DIAGNOSIS — G4733 Obstructive sleep apnea (adult) (pediatric): Secondary | ICD-10-CM | POA: Diagnosis not present

## 2020-08-19 DIAGNOSIS — E119 Type 2 diabetes mellitus without complications: Secondary | ICD-10-CM | POA: Diagnosis not present

## 2020-08-19 DIAGNOSIS — B379 Candidiasis, unspecified: Secondary | ICD-10-CM | POA: Diagnosis not present

## 2020-08-27 DIAGNOSIS — Z1231 Encounter for screening mammogram for malignant neoplasm of breast: Secondary | ICD-10-CM | POA: Diagnosis not present

## 2020-08-27 DIAGNOSIS — Z853 Personal history of malignant neoplasm of breast: Secondary | ICD-10-CM | POA: Diagnosis not present

## 2020-09-21 DIAGNOSIS — E039 Hypothyroidism, unspecified: Secondary | ICD-10-CM | POA: Diagnosis not present

## 2020-09-21 DIAGNOSIS — E119 Type 2 diabetes mellitus without complications: Secondary | ICD-10-CM | POA: Diagnosis not present

## 2020-09-21 DIAGNOSIS — R05 Cough: Secondary | ICD-10-CM | POA: Diagnosis not present

## 2020-10-08 DIAGNOSIS — G4733 Obstructive sleep apnea (adult) (pediatric): Secondary | ICD-10-CM | POA: Diagnosis not present

## 2020-11-02 DIAGNOSIS — L84 Corns and callosities: Secondary | ICD-10-CM | POA: Diagnosis not present

## 2020-11-02 DIAGNOSIS — L603 Nail dystrophy: Secondary | ICD-10-CM | POA: Diagnosis not present

## 2020-11-02 DIAGNOSIS — E119 Type 2 diabetes mellitus without complications: Secondary | ICD-10-CM | POA: Diagnosis not present

## 2020-11-02 DIAGNOSIS — M21612 Bunion of left foot: Secondary | ICD-10-CM | POA: Diagnosis not present

## 2020-11-02 DIAGNOSIS — B351 Tinea unguium: Secondary | ICD-10-CM | POA: Diagnosis not present

## 2020-11-02 DIAGNOSIS — M79674 Pain in right toe(s): Secondary | ICD-10-CM | POA: Diagnosis not present

## 2020-11-02 DIAGNOSIS — M21611 Bunion of right foot: Secondary | ICD-10-CM | POA: Diagnosis not present

## 2020-12-07 DIAGNOSIS — I10 Essential (primary) hypertension: Secondary | ICD-10-CM | POA: Diagnosis not present
